# Patient Record
Sex: Female | Born: 1950 | Race: White | Hispanic: No | Smoking: Never smoker
Health system: Southern US, Community
[De-identification: ages and names within clinical notes are randomized; demographics above are authoritative.]

## PROBLEM LIST (undated history)

## (undated) DIAGNOSIS — E538 Deficiency of other specified B group vitamins: Secondary | ICD-10-CM

## (undated) DIAGNOSIS — I10 Essential (primary) hypertension: Secondary | ICD-10-CM

## (undated) HISTORY — PX: FLEXIBLE SIGMOIDOSCOPY: SHX1649

---

## 1988-12-26 HISTORY — PX: ABDOMINAL HYSTERECTOMY: SHX81

## 2014-08-08 LAB — LIPID PANEL
Cholesterol: 219 mg/dL — AB (ref 0–200)
HDL: 67 mg/dL (ref 35–70)
LDL Cholesterol: 129 mg/dL
Triglycerides: 115 mg/dL (ref 40–160)

## 2014-08-08 LAB — BASIC METABOLIC PANEL
BUN: 15 mg/dL (ref 4–21)
Creatinine: 0.7 mg/dL (ref 0.5–1.1)

## 2014-08-08 LAB — CBC AND DIFFERENTIAL: Hemoglobin: 13.1 g/dL (ref 12.0–16.0)

## 2014-08-08 LAB — TSH: TSH: 3.7 u[IU]/mL (ref 0.41–5.90)

## 2015-08-19 ENCOUNTER — Other Ambulatory Visit: Payer: Self-pay | Admitting: Internal Medicine

## 2015-11-21 ENCOUNTER — Other Ambulatory Visit: Payer: Self-pay | Admitting: Internal Medicine

## 2015-11-21 ENCOUNTER — Encounter: Payer: Self-pay | Admitting: Internal Medicine

## 2015-11-21 DIAGNOSIS — E538 Deficiency of other specified B group vitamins: Secondary | ICD-10-CM | POA: Insufficient documentation

## 2015-11-21 DIAGNOSIS — I10 Essential (primary) hypertension: Secondary | ICD-10-CM | POA: Insufficient documentation

## 2015-11-21 DIAGNOSIS — J302 Other seasonal allergic rhinitis: Secondary | ICD-10-CM | POA: Insufficient documentation

## 2015-11-21 DIAGNOSIS — N951 Menopausal and female climacteric states: Secondary | ICD-10-CM | POA: Insufficient documentation

## 2015-11-21 DIAGNOSIS — E785 Hyperlipidemia, unspecified: Secondary | ICD-10-CM | POA: Insufficient documentation

## 2015-11-21 DIAGNOSIS — D510 Vitamin B12 deficiency anemia due to intrinsic factor deficiency: Secondary | ICD-10-CM | POA: Insufficient documentation

## 2015-11-21 DIAGNOSIS — G5600 Carpal tunnel syndrome, unspecified upper limb: Secondary | ICD-10-CM | POA: Insufficient documentation

## 2015-12-30 ENCOUNTER — Other Ambulatory Visit: Payer: Self-pay | Admitting: Internal Medicine

## 2015-12-30 ENCOUNTER — Encounter: Payer: Self-pay | Admitting: Internal Medicine

## 2015-12-30 ENCOUNTER — Telehealth: Payer: Self-pay

## 2015-12-30 ENCOUNTER — Other Ambulatory Visit: Payer: Self-pay

## 2015-12-30 MED ORDER — KETOCONAZOLE 2 % EX SHAM: 1.0000 "application " | MEDICATED_SHAMPOO | CUTANEOUS | Status: DC

## 2015-12-30 NOTE — Telephone Encounter (Signed)
Patient was scheduled for appointment with you today and she states that she is okay with not having the appointment but, states that she will need a refill of her Nizoral and would like this sent to [facility] in [place].

## 2016-03-18 ENCOUNTER — Ambulatory Visit
Admission: RE | Admit: 2016-03-18 | Discharge: 2016-03-18 | Disposition: A | Discharge status: Home / Self Care | Payer: Medicare Other | Source: Ambulatory Visit | Attending: Internal Medicine | Admitting: Internal Medicine

## 2016-03-18 ENCOUNTER — Ambulatory Visit (INDEPENDENT_AMBULATORY_CARE_PROVIDER_SITE_OTHER): Payer: Self-pay | Admitting: Internal Medicine

## 2016-03-18 ENCOUNTER — Encounter: Payer: Self-pay | Admitting: Internal Medicine

## 2016-03-18 VITALS — BP 128/64 | HR 76 | Ht 63.0 in | Wt 152.4 lb

## 2016-03-18 DIAGNOSIS — M25511 Pain in right shoulder: Secondary | ICD-10-CM | POA: Insufficient documentation

## 2016-03-18 DIAGNOSIS — Z1211 Encounter for screening for malignant neoplasm of colon: Secondary | ICD-10-CM

## 2016-03-18 DIAGNOSIS — J302 Other seasonal allergic rhinitis: Secondary | ICD-10-CM

## 2016-03-18 DIAGNOSIS — Z0000 Encounter for general adult medical examination without abnormal findings: Secondary | ICD-10-CM

## 2016-03-18 DIAGNOSIS — E785 Hyperlipidemia, unspecified: Secondary | ICD-10-CM

## 2016-03-18 DIAGNOSIS — Z23 Encounter for immunization: Secondary | ICD-10-CM

## 2016-03-18 DIAGNOSIS — Z1239 Encounter for other screening for malignant neoplasm of breast: Secondary | ICD-10-CM

## 2016-03-18 DIAGNOSIS — I10 Essential (primary) hypertension: Secondary | ICD-10-CM

## 2016-03-18 DIAGNOSIS — D510 Vitamin B12 deficiency anemia due to intrinsic factor deficiency: Secondary | ICD-10-CM

## 2016-03-18 DIAGNOSIS — S42309A Unspecified fracture of shaft of humerus, unspecified arm, initial encounter for closed fracture: Secondary | ICD-10-CM | POA: Insufficient documentation

## 2016-03-18 HISTORY — DX: Unspecified fracture of shaft of humerus, unspecified arm, initial encounter for closed fracture: S42.309A

## 2016-03-18 LAB — POCT URINALYSIS DIPSTICK
BILIRUBIN UA: NEGATIVE
GLUCOSE UA: NEGATIVE
Ketones, UA: NEGATIVE
LEUKOCYTES UA: NEGATIVE
NITRITE UA: NEGATIVE
PH UA: 7
Protein, UA: NEGATIVE
RBC UA: NEGATIVE
Spec Grav, UA: 1.015
UROBILINOGEN UA: 0.2

## 2016-03-18 MED ORDER — CYANOCOBALAMIN 1000 MCG/ML IJ SOLN: 1000.0000 ug | INTRAMUSCULAR | Status: DC

## 2016-03-18 NOTE — Patient Instructions (Addendum)
Health Maintenance  Topic Date Due  . MAMMOGRAM  02/21/1999  . ZOSTAVAX  02/21/2009  . COLONOSCOPY  12/27/2010  . PNA vac Low Risk Adult (1 of 2 - PCV13) 02/21/2014  . INFLUENZA VACCINE  03/25/2016 (Originally 07/27/2015)  . Hepatitis C Screening  03/18/2021 (Originally [birth year])  . TETANUS/TDAP  06/27/2023  . DEXA SCAN  Addressed   Pneumococcal Conjugate Vaccine (PCV13)  1. Why get vaccinated? Vaccination can protect both children and adults from pneumococcal disease. Pneumococcal disease is caused by bacteria that can spread from person to person through close contact. It can cause ear infections, and it can also lead to more serious infections of the: 1. Lungs (pneumonia), 2. Blood (bacteremia), and 3. Covering of the brain and spinal cord (meningitis). Pneumococcal pneumonia is most common among adults. Pneumococcal meningitis can cause deafness and brain damage, and it kills about 1 child in 10 who get it. Anyone can get pneumococcal disease, but children under 38 years of age and adults 47 years and older, people with certain medical conditions, and cigarette smokers are at the highest risk. Before there was a vaccine, the [place] saw:  more than 700 cases of meningitis,  about 13,000 blood infections,  about 5 million ear infections, and  about 200 deaths in children under 5 each year from pneumococcal disease. Since vaccine became available, severe pneumococcal disease in these children has fallen by 88%. About 18,000 older adults die of pneumococcal disease each year in the [place]. Treatment of pneumococcal infections with penicillin and other drugs is not as effective as it used to be, because some strains of the disease have become resistant to these drugs. This makes prevention of the disease, through vaccination, even more important. 2. PCV13 vaccine Pneumococcal conjugate vaccine (called PCV13) protects against 13 types of pneumococcal bacteria. PCV13 is  routinely given to children at 2, 4, 6, and 40-58 months of age. It is also recommended for children and adults 1 to 56 years of age with certain health conditions, and for all adults 5 years of age and older. Your doctor can give you details. 3. Some people should not get this vaccine Anyone who has ever had a life-threatening allergic reaction to a dose of this vaccine, to an earlier pneumococcal vaccine called PCV7, or to any vaccine containing diphtheria toxoid (for example, DTaP), should not get PCV13. Anyone with a severe allergy to any component of PCV13 should not get the vaccine. Tell your doctor if the person being vaccinated has any severe allergies. If the person scheduled for vaccination is not feeling well, your healthcare provider might decide to reschedule the shot on another day. 4. Risks of a vaccine reaction With any medicine, including vaccines, there is a chance of reactions. These are usually mild and go away on their own, but serious reactions are also possible. Problems reported following PCV13 varied by age and dose in the series. The most common problems reported among children were:  About half became drowsy after the shot, had a temporary loss of appetite, or had redness or tenderness where the shot was given.  About 1 out of 3 had swelling where the shot was given.  About 1 out of 3 had a mild fever, and about 1 in 20 had a fever over 102.26F.  Up to about 8 out of 10 became fussy or irritable. Adults have reported pain, redness, and swelling where the shot was given; also mild fever, fatigue, headache, chills, or muscle pain. Young  children who get PCV13 along with inactivated flu vaccine at the same time may be at increased risk for seizures caused by fever. Ask your doctor for more information. Problems that could happen after any vaccine:  People sometimes faint after a medical procedure, including vaccination. Sitting or lying down for about 15 minutes can help  prevent fainting, and injuries caused by a fall. Tell your doctor if you feel dizzy, or have vision changes or ringing in the ears.  Some older children and adults get severe pain in the shoulder and have difficulty moving the arm where a shot was given. This happens very rarely.  Any medication can cause a severe allergic reaction. Such reactions from a vaccine are very rare, estimated at about 1 in a million doses, and would happen within a few minutes to a few hours after the vaccination. As with any medicine, there is a very small chance of a vaccine causing a serious injury or death. The safety of vaccines is always being monitored. For more information, visit: http://www.aguilar.org/ 5. What if there is a serious reaction? What should I look for?  Look for anything that concerns you, such as signs of a severe allergic reaction, very high fever, or unusual behavior. Signs of a severe allergic reaction can include hives, swelling of the face and throat, difficulty breathing, a fast heartbeat, dizziness, and weakness-usually within a few minutes to a few hours after the vaccination. What should I do?  If you think it is a severe allergic reaction or other emergency that can't wait, call 9-1-1 or get the person to the nearest hospital. Otherwise, call your doctor. Reactions should be reported to the Vaccine Adverse Event Reporting System (VAERS). Your doctor should file this report, or you can do it yourself through the VAERS web site at www.vaers.SamedayNews.es, or by calling 204-190-3588. VAERS does not give medical advice. 6. The National Vaccine Injury Compensation Program The [employer] Vaccine Injury Compensation Program (VICP) is a federal program that was created to compensate people who may have been injured by certain vaccines. Persons who believe they may have been injured by a vaccine can learn about the program and about filing a claim by calling 845-533-4303 or visiting the [place]  website at GoldCloset.com.ee. There is a time limit to file a claim for compensation. 7. How can I learn more?  Ask your healthcare provider. He or she can give you the vaccine package insert or suggest other sources of information.  Call your local or state health department.  Contact the Centers for Disease Control and Prevention (CDC):  Call 801 423 8272 (1-800-CDC-INFO) or  Visit CDC's website at http://hunter.com/ Vaccine Information Statement PCV13 Vaccine (10/30/2014)   This information is not intended to replace advice given to you by your health care provider. Make sure you discuss any questions you have with your health care provider.   Document Released: 10/09/2006 Document Revised: 01/02/2015 Document Reviewed: 11/06/2014 Elsevier Interactive Patient Education 2016 [employer] Practicing breast self-awareness may pick up problems early, prevent significant medical complications, and possibly save your life. By practicing breast self-awareness, you can become familiar with how your breasts look and feel and if your breasts are changing. This allows you to notice changes early. It can also offer you some reassurance that your breast health is good. One way to learn what is normal for your breasts and whether your breasts are changing is to do a breast self-exam. If you find a lump or something that was not  present in the past, it is best to contact your caregiver right away. Other findings that should be evaluated by your caregiver include nipple discharge, especially if it is bloody; skin changes or reddening; areas where the skin seems to be pulled in (retracted); or new lumps and bumps. Breast pain is seldom associated with cancer (malignancy), but should also be evaluated by a caregiver. HOW TO PERFORM A BREAST SELF-EXAM The best time to examine your breasts is 5-7 days after your menstrual period is over. During menstruation, the  breasts are lumpier, and it may be more difficult to pick up changes. If you do not menstruate, have reached menopause, or had your uterus removed (hysterectomy), you should examine your breasts at regular intervals, such as monthly. If you are breastfeeding, examine your breasts after a feeding or after using a breast pump. Breast implants do not decrease the risk for lumps or tumors, so continue to perform breast self-exams as recommended. Talk to your caregiver about how to determine the difference between the implant and breast tissue. Also, talk about the amount of pressure you should use during the exam. Over time, you will become more familiar with the variations of your breasts and more comfortable with the exam. A breast self-exam requires you to remove all your clothes above the waist. 4. Look at your breasts and nipples. Stand in front of a mirror in a room with good lighting. With your hands on your hips, push your hands firmly downward. Look for a difference in shape, contour, and size from one breast to the other (asymmetry). Asymmetry includes puckers, dips, or bumps. Also, look for skin changes, such as reddened or scaly areas on the breasts. Look for nipple changes, such as discharge, dimpling, repositioning, or redness. 5. Carefully feel your breasts. This is best done either in the shower or tub while using soapy water or when flat on your back. Place the arm (on the side of the breast you are examining) above your head. Use the pads (not the fingertips) of your three middle fingers on your opposite hand to feel your breasts. Start in the underarm area and use  inch (2 cm) overlapping circles to feel your breast. Use 3 different levels of pressure (light, medium, and firm pressure) at each circle before moving to the next circle. The light pressure is needed to feel the tissue closest to the skin. The medium pressure will help to feel breast tissue a little deeper, while the firm pressure is  needed to feel the tissue close to the ribs. Continue the overlapping circles, moving downward over the breast until you feel your ribs below your breast. Then, move one finger-width towards the center of the body. Continue to use the  inch (2 cm) overlapping circles to feel your breast as you move slowly up toward the collar bone (clavicle) near the base of the neck. Continue the up and down exam using all 3 pressures until you reach the middle of the chest. Do this with each breast, carefully feeling for lumps or changes. 6.  Keep a written record with breast changes or normal findings for each breast. By writing this information down, you do not need to depend only on memory for size, tenderness, or location. Write down where you are in your menstrual cycle, if you are still menstruating. Breast tissue can have some lumps or thick tissue. However, see your caregiver if you find anything that concerns you.  SEEK MEDICAL CARE IF:  You see  a change in shape, contour, or size of your breasts or nipples.   You see skin changes, such as reddened or scaly areas on the breasts or nipples.   You have an unusual discharge from your nipples.   You feel a new lump or unusually thick areas.    This information is not intended to replace advice given to you by your health care provider. Make sure you discuss any questions you have with your health care provider.   Document Released: 12/12/2005 Document Revised: 11/28/2012 Document Reviewed: 03/28/2012 Elsevier Interactive Patient Education [employer].

## 2016-03-18 NOTE — Progress Notes (Signed)
Patient: Shelly Fischer, Female    DOB: 02/18/1951, 65 y.o.   MRN: [id number] Visit Date: 03/18/2016  Today's Provider: Halina Maidens, MD   Chief Complaint  Patient presents with  . Medicare Wellness   Subjective:    Annual wellness visit Shelly Fischer is a 65 y.o. female who presents today for her Subsequent Annual Wellness Visit. She feels fairly well. She reports exercising intermittently. She reports she is sleeping well. She has not been having mammograms -but will have one this year. She is also due for colonoscopy - very hesitant due to discomfort from last one. ----------------------------------------------------------- Hypertension This is a chronic problem. The current episode started more than 1 year ago. The problem is unchanged. The problem is controlled. Pertinent negatives include no chest pain, headaches, palpitations or shortness of breath. Past treatments include ACE inhibitors and diuretics.  Shoulder pain - no distinct injury.  It has improved with otc pain patches and reducing repetitive use.  She also using Advil 2 tabs per day.  Review of Systems  Constitutional: Negative for fever, chills and fatigue.  HENT: Negative for congestion, hearing loss, tinnitus, trouble swallowing and voice change.   Eyes: Negative for visual disturbance.  Respiratory: Negative for cough, chest tightness, shortness of breath and wheezing.   Cardiovascular: Negative for chest pain, palpitations and leg swelling.  Gastrointestinal: Negative for vomiting, abdominal pain, diarrhea and constipation.  Endocrine: Negative for polydipsia and polyuria.  Genitourinary: Negative for dysuria, frequency, vaginal bleeding, vaginal discharge and genital sores.  Musculoskeletal: Positive for arthralgias (right shoulder). Negative for joint swelling and gait problem.  Skin: Negative for color change and rash.  Neurological: Negative for dizziness, tremors, light-headedness and headaches.    Hematological: Negative for adenopathy. Does not bruise/bleed easily.  Psychiatric/Behavioral: Negative for sleep disturbance and dysphoric mood. The patient is not nervous/anxious.     Social History   Social History  . Marital Status: Unknown    Spouse Name: N/A  . Number of Children: N/A  . Years of Education: N/A   Occupational History  . Not on file.   Social History Main Topics  . Smoking status: Never Smoker   . Smokeless tobacco: Not on file  . Alcohol Use: No  . Drug Use: No  . Sexual Activity: Not on file   Other Topics Concern  . Not on file   Social History Narrative    Patient Active Problem List   Diagnosis Date Noted  . Carpal tunnel syndrome 11/21/2015  . Dyslipidemia 11/21/2015  . Essential (primary) hypertension 11/21/2015  . Hot flash, menopausal 11/21/2015  . Addison anemia 11/21/2015  . Allergic rhinitis, seasonal 11/21/2015    Past Surgical History  Procedure Laterality Date  . Abdominal hysterectomy      Her family history includes CAD (age of onset: 37) in her brother; CAD (age of onset: 77) in her mother.    Previous Medications   ASPIRIN 81 MG TABLET    Take 81 mg by mouth 2 (two) times a week.   CETIRIZINE (ZYRTEC) 10 MG TABLET    Take 1 tablet by mouth daily.   CYANOCOBALAMIN (,VITAMIN B-12,) 1000 MCG/ML INJECTION    Inject 1 mL into the muscle every 30 (thirty) days.   FLAXSEED, LINSEED, (FLAX SEED OIL) 1000 MG CAPS    Take by mouth.   IBUPROFEN (ADVIL,MOTRIN) 200 MG TABLET    Take 200 mg by mouth 2 (two) times daily.   KETOCONAZOLE (NIZORAL) 2 % SHAMPOO  Apply 1 application topically 2 (two) times a week.   LOSARTAN-HYDROCHLOROTHIAZIDE (HYZAAR) 100-25 MG PER TABLET    TAKE 1 TABLET BY MOUTH DAILY    No care team member to display     Objective:   Vitals: BP 128/64 mmHg  Pulse 76  Ht 5\' 3"  (1.6 m)  Wt 152 lb 6.4 oz (69.128 kg)  BMI 27.00 kg/m2  Physical Exam  Constitutional: She is oriented to person, place, and time.  She appears well-developed and well-nourished. No distress.  HENT:  Head: Normocephalic and atraumatic.  Right Ear: Tympanic membrane and ear canal normal.  Left Ear: Tympanic membrane and ear canal normal.  Nose: Right sinus exhibits no maxillary sinus tenderness. Left sinus exhibits no maxillary sinus tenderness.  Mouth/Throat: Uvula is midline and oropharynx is clear and moist.  Eyes: Conjunctivae and EOM are normal. Right eye exhibits no discharge. Left eye exhibits no discharge. No scleral icterus.  Neck: Normal range of motion. Carotid bruit is not present. No erythema present. No thyromegaly present.  Cardiovascular: Normal rate, regular rhythm, normal heart sounds and normal pulses.   Pulmonary/Chest: Effort normal. No respiratory distress. She has no wheezes. Right breast exhibits no mass, no nipple discharge, no skin change and no tenderness. Left breast exhibits no mass, no nipple discharge, no skin change and no tenderness.  Abdominal: Soft. Bowel sounds are normal. There is no hepatosplenomegaly. There is no tenderness. There is no CVA tenderness.  Musculoskeletal:       Right shoulder: She exhibits decreased range of motion and tenderness (over AC joint).  Lymphadenopathy:    She has no cervical adenopathy.    She has no axillary adenopathy.  Neurological: She is alert and oriented to person, place, and time. She has normal strength and normal reflexes. No cranial nerve deficit or sensory deficit.  Skin: Skin is warm, dry and intact. No rash noted.  Psychiatric: She has a normal mood and affect. Her speech is normal and behavior is normal. Thought content normal.  Nursing note and vitals reviewed.   Activities of Daily Living In your present state of health, do you have any difficulty performing the following activities: 03/18/2016  Hearing? N  Vision? N  Difficulty concentrating or making decisions? N  Walking or climbing stairs? N  Dressing or bathing? N  Doing errands,  shopping? N    Fall Risk Assessment Fall Risk  03/18/2016  Falls in the past year? No      Depression Screen PHQ 2/9 Scores 03/18/2016  PHQ - 2 Score 0    Cognitive Testing - 6-CIT   Correct? Score   What year is it? yes 0 Yes = 0    No = 4  What month is it? yes 0 Yes = 0    No = 3  Remember:     [address]ia [address], [place], [place]     What time is it? yes 0 Yes = 0    No = 3  Count backwards from 20 to 1 yes 0 Correct = 0    1 error = 2   More than 1 error = 4  Say the months of the year in reverse. yes 0 Correct = 0    1 error = 2   More than 1 error = 4  What address did I ask you to remember? yes 0 Correct = 0  1 error = 2    2 error = 4    3 error =  6    4 error = 8    All wrong = 10       TOTAL SCORE  0/28   Interpretation:  Normal  Normal (0-7) Abnormal (8-28)      Medicare Annual Wellness Visit Summary:  Reviewed patient's Family Medical History Reviewed and updated list of patient's medical providers Assessment of cognitive impairment was done Assessed patient's functional ability Established a written schedule for health screening [place] Completed and Reviewed  Exercise Activities and Dietary recommendations Goals    None      Immunization History  Administered Date(s) Administered  . Tdap 06/26/2013    Health Maintenance  Topic Date Due  . Hepatitis C Screening  July 24, 1951  . MAMMOGRAM  02/21/1999  . ZOSTAVAX  02/21/2009  . COLONOSCOPY  12/27/2010  . PNA vac Low Risk Adult (1 of 2 - PCV13) 02/21/2014  . INFLUENZA VACCINE  03/25/2016 (Originally 07/27/2015)  . TETANUS/TDAP  06/27/2023  . DEXA SCAN  Addressed     Discussed health benefits of physical activity, and encouraged her to engage in regular exercise appropriate for her age and condition.    ------------------------------------------------------------------------------------------------------------   Assessment & Plan:  1. Medicare annual wellness visit,  subsequent Measures satisfied  2. Essential (primary) hypertension controlled - CBC with Differential/Platelet - Comprehensive metabolic panel - TSH - POCT urinalysis dipstick  3. Allergic rhinitis, seasonal  4. Dyslipidemia Will advise if medication is needed - Lipid panel  5. Addison anemia Continue monthly B12 injection - cyanocobalamin (,VITAMIN B-12,) 1000 MCG/ML injection; Inject 1 mL (1,000 mcg total) into the muscle every 30 (thirty) days.  Dispense: 1 mL; Refill: 12  6. Colon cancer screening - Ambulatory referral to Gastroenterology  7. Breast cancer screening - MM DIGITAL SCREENING BILATERAL; Future  8. Need for pneumococcal vaccination - Pneumococcal conjugate vaccine 13-valent IM  9. Shoulder pain, right Seems like AC joint arthritis; continue Advil as needed and ice or heat; modified activities - DG Shoulder Right; Future  Rx given to get Zostavax after 04/29/16  Halina Maidens, MD [facility]  03/18/2016

## 2016-03-19 LAB — LIPID PANEL
CHOLESTEROL TOTAL: 210 mg/dL — AB (ref 100–199)
Chol/HDL Ratio: 3.1 ratio units (ref 0.0–4.4)
HDL: 68 mg/dL (ref >39)
LDL CALC: 117 mg/dL — AB (ref 0–99)
TRIGLYCERIDES: 124 mg/dL (ref 0–149)
VLDL CHOLESTEROL CAL: 25 mg/dL (ref 5–40)

## 2016-03-19 LAB — COMPREHENSIVE METABOLIC PANEL
ALK PHOS: 112 IU/L (ref 39–117)
ALT: 31 IU/L (ref 0–32)
AST: 24 IU/L (ref 0–40)
Albumin/Globulin Ratio: 2 (ref 1.2–2.2)
Albumin: 4.5 g/dL (ref 3.6–4.8)
BILIRUBIN TOTAL: 0.4 mg/dL (ref 0.0–1.2)
BUN/Creatinine Ratio: 21 (ref 11–26)
BUN: 13 mg/dL (ref 8–27)
CHLORIDE: 97 mmol/L (ref 96–106)
CO2: 27 mmol/L (ref 18–29)
CREATININE: 0.62 mg/dL (ref 0.57–1.00)
Calcium: 9.4 mg/dL (ref 8.7–10.3)
GFR calc Af Amer: 108 mL/min/{1.73_m2} (ref >59)
GFR calc non Af Amer: 94 mL/min/{1.73_m2} (ref >59)
GLUCOSE: 93 mg/dL (ref 65–99)
Globulin, Total: 2.3 g/dL (ref 1.5–4.5)
Potassium: 3.9 mmol/L (ref 3.5–5.2)
SODIUM: 142 mmol/L (ref 134–144)
Total Protein: 6.8 g/dL (ref 6.0–8.5)

## 2016-03-19 LAB — CBC WITH DIFFERENTIAL/PLATELET
BASOS ABS: 0.1 10*3/uL (ref 0.0–0.2)
Basos: 1 %
EOS (ABSOLUTE): 0.3 10*3/uL (ref 0.0–0.4)
Eos: 6 %
Hematocrit: 39.1 % (ref 34.0–46.6)
Hemoglobin: 13.1 g/dL (ref 11.1–15.9)
Immature Grans (Abs): 0 10*3/uL (ref 0.0–0.1)
Immature Granulocytes: 0 %
LYMPHS ABS: 1.2 10*3/uL (ref 0.7–3.1)
Lymphs: 25 %
MCH: 28.9 pg (ref 26.6–33.0)
MCHC: 33.5 g/dL (ref 31.5–35.7)
MCV: 86 fL (ref 79–97)
MONOCYTES: 9 %
MONOS ABS: 0.4 10*3/uL (ref 0.1–0.9)
Neutrophils Absolute: 2.7 10*3/uL (ref 1.4–7.0)
Neutrophils: 59 %
Platelets: 251 10*3/uL (ref 150–379)
RBC: 4.54 x10E6/uL (ref 3.77–5.28)
RDW: 14 % (ref 12.3–15.4)
WBC: 4.7 10*3/uL (ref 3.4–10.8)

## 2016-03-19 LAB — TSH: TSH: 4.75 u[IU]/mL — ABNORMAL HIGH (ref 0.450–4.500)

## 2016-03-21 ENCOUNTER — Telehealth: Payer: Self-pay

## 2016-03-21 NOTE — Telephone Encounter (Signed)
Spoke with patient. Patient advised of all results and verbalized understanding. Will call back with any future questions or concerns. MAH  

## 2016-03-21 NOTE — Telephone Encounter (Signed)
-----   Message from Glean Hess, MD sent at 03/19/2016 12:57 PM EDT ----- Labs are normal.  Cholesterol is great.  Thyroid may be borderline low but no medication is needed at this time.  Recommend rechecking in 6 months.

## 2016-03-21 NOTE — Telephone Encounter (Signed)
-----   Message from Glean Hess, MD sent at 03/18/2016  1:12 PM EDT ----- Shoulder xray shows a small bone spur which is likely the cause of symptoms.  If worsening, would consult Orthopedics.

## 2016-03-21 NOTE — Telephone Encounter (Signed)
Left message for patient to call back  

## 2016-06-07 ENCOUNTER — Other Ambulatory Visit: Payer: Self-pay | Admitting: Internal Medicine

## 2016-08-10 ENCOUNTER — Other Ambulatory Visit: Payer: Self-pay | Admitting: Internal Medicine

## 2017-02-27 IMAGING — CR DG SHOULDER 2+V*R*
3 series · 3 of 3 positions shown · non-contrast
Comparison: None impacts

CLINICAL DATA: Six-month history of right shoulder pain centered
over the proximal humerus and humeral head; painful range of motion,
no known injury.

EXAM:
RIGHT SHOULDER - 2+ VIEW

[shoulder grashey]
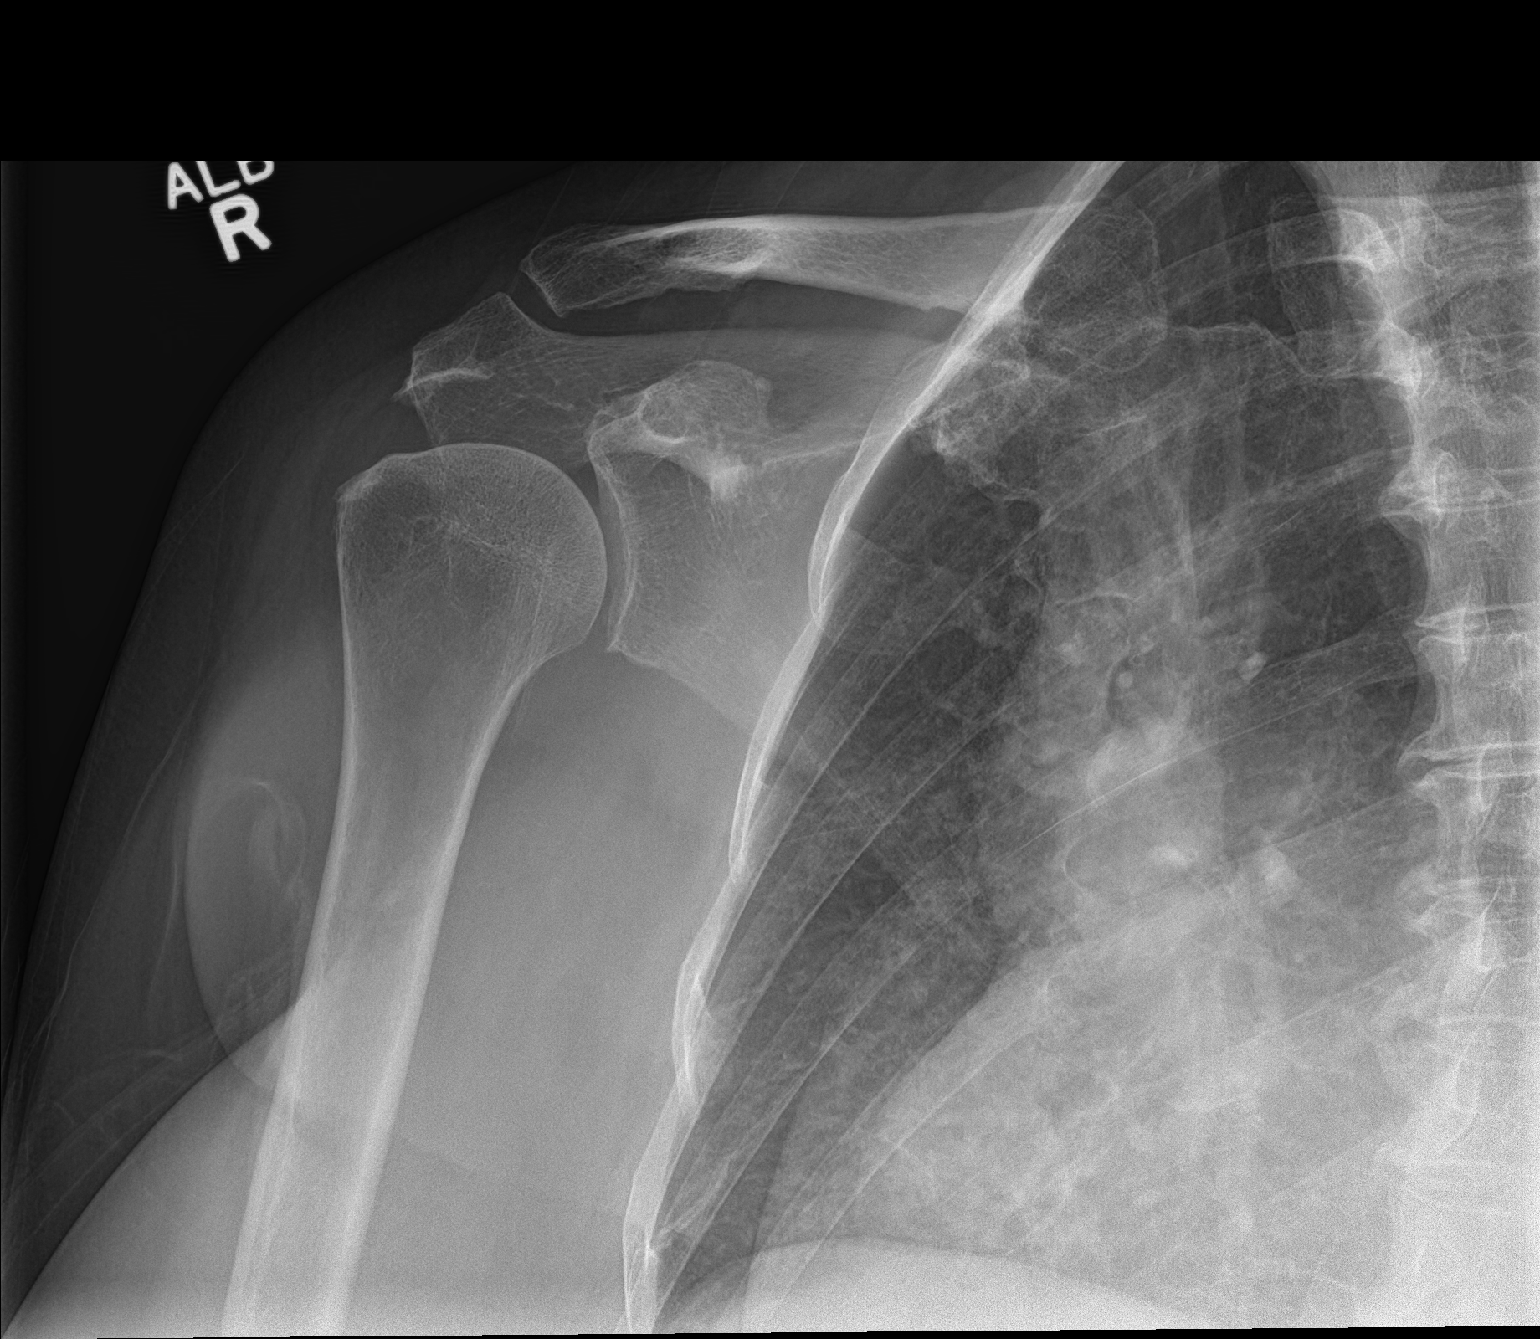

[shoulder y view]
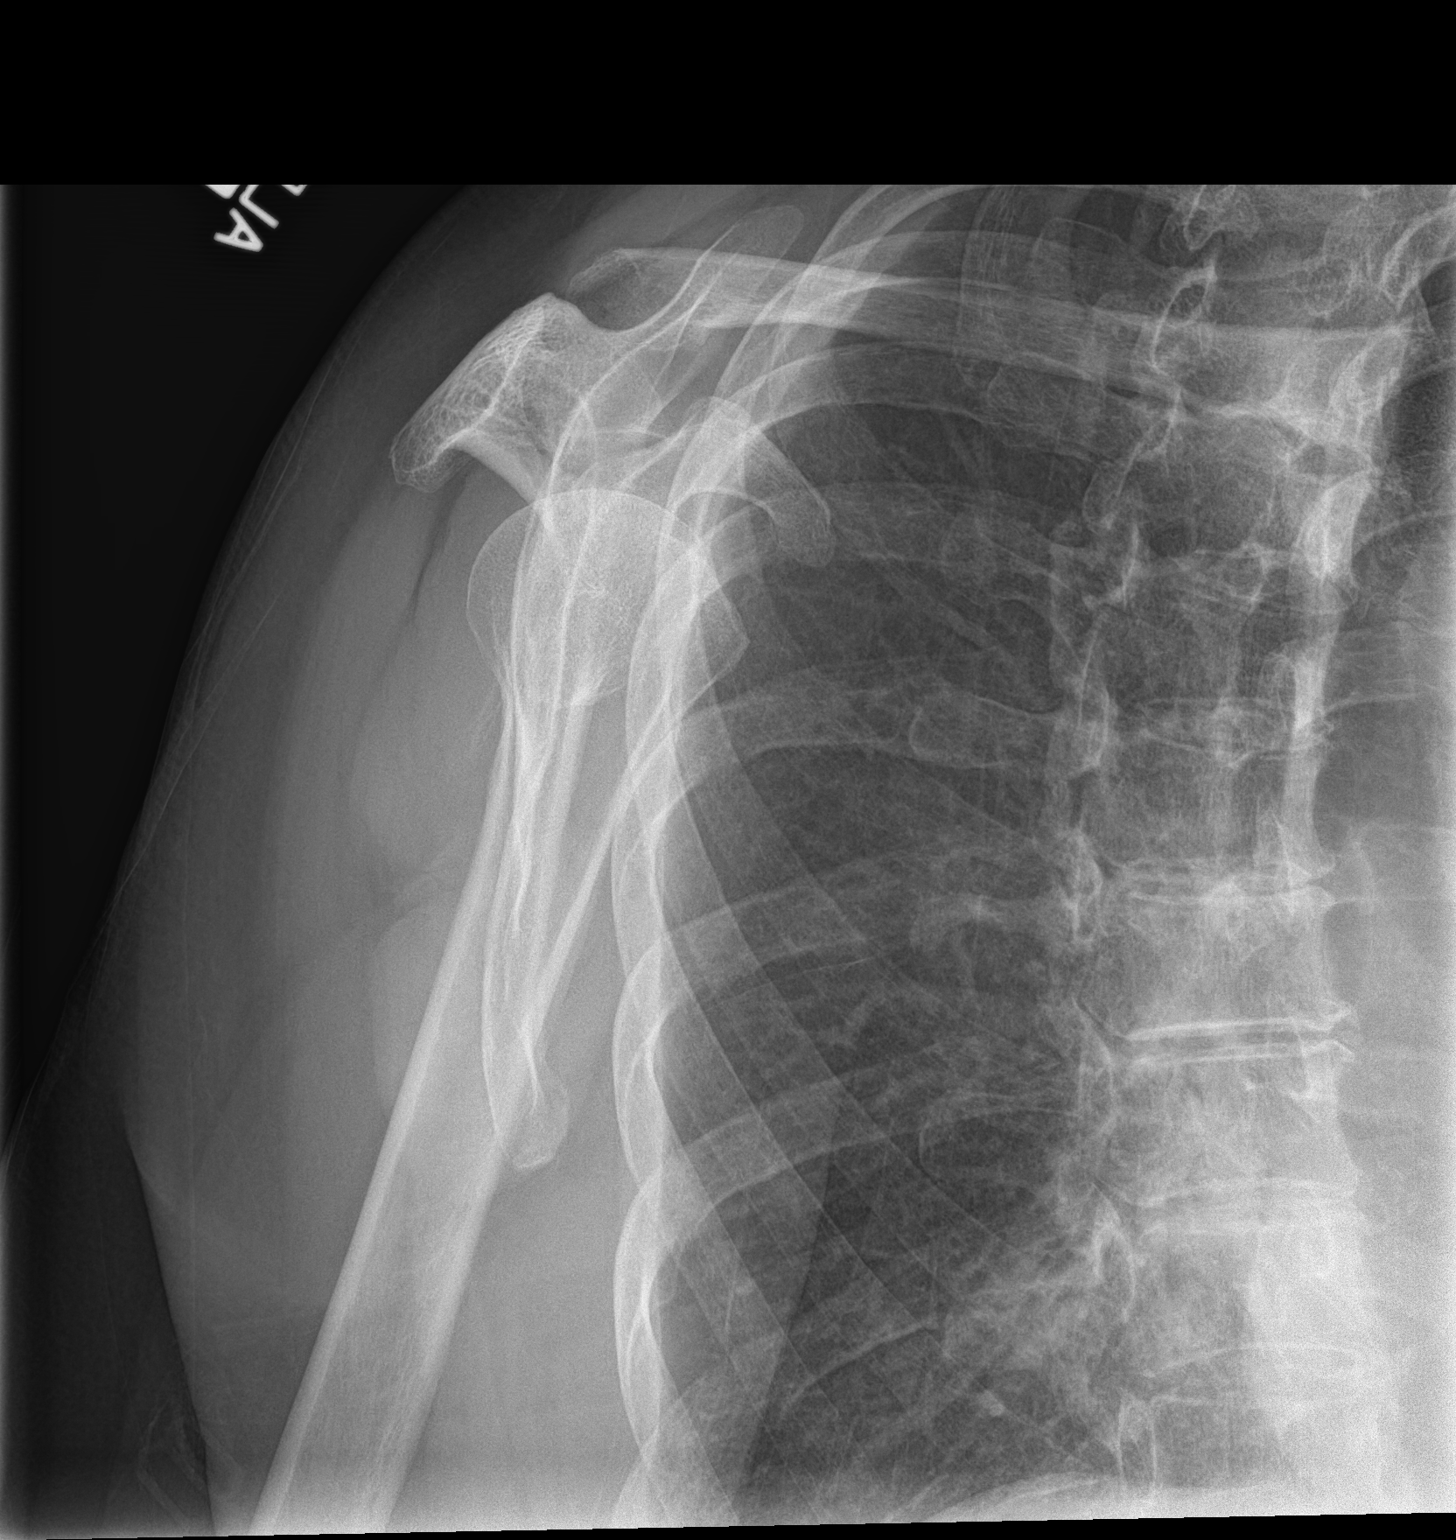

[shoulder axial]
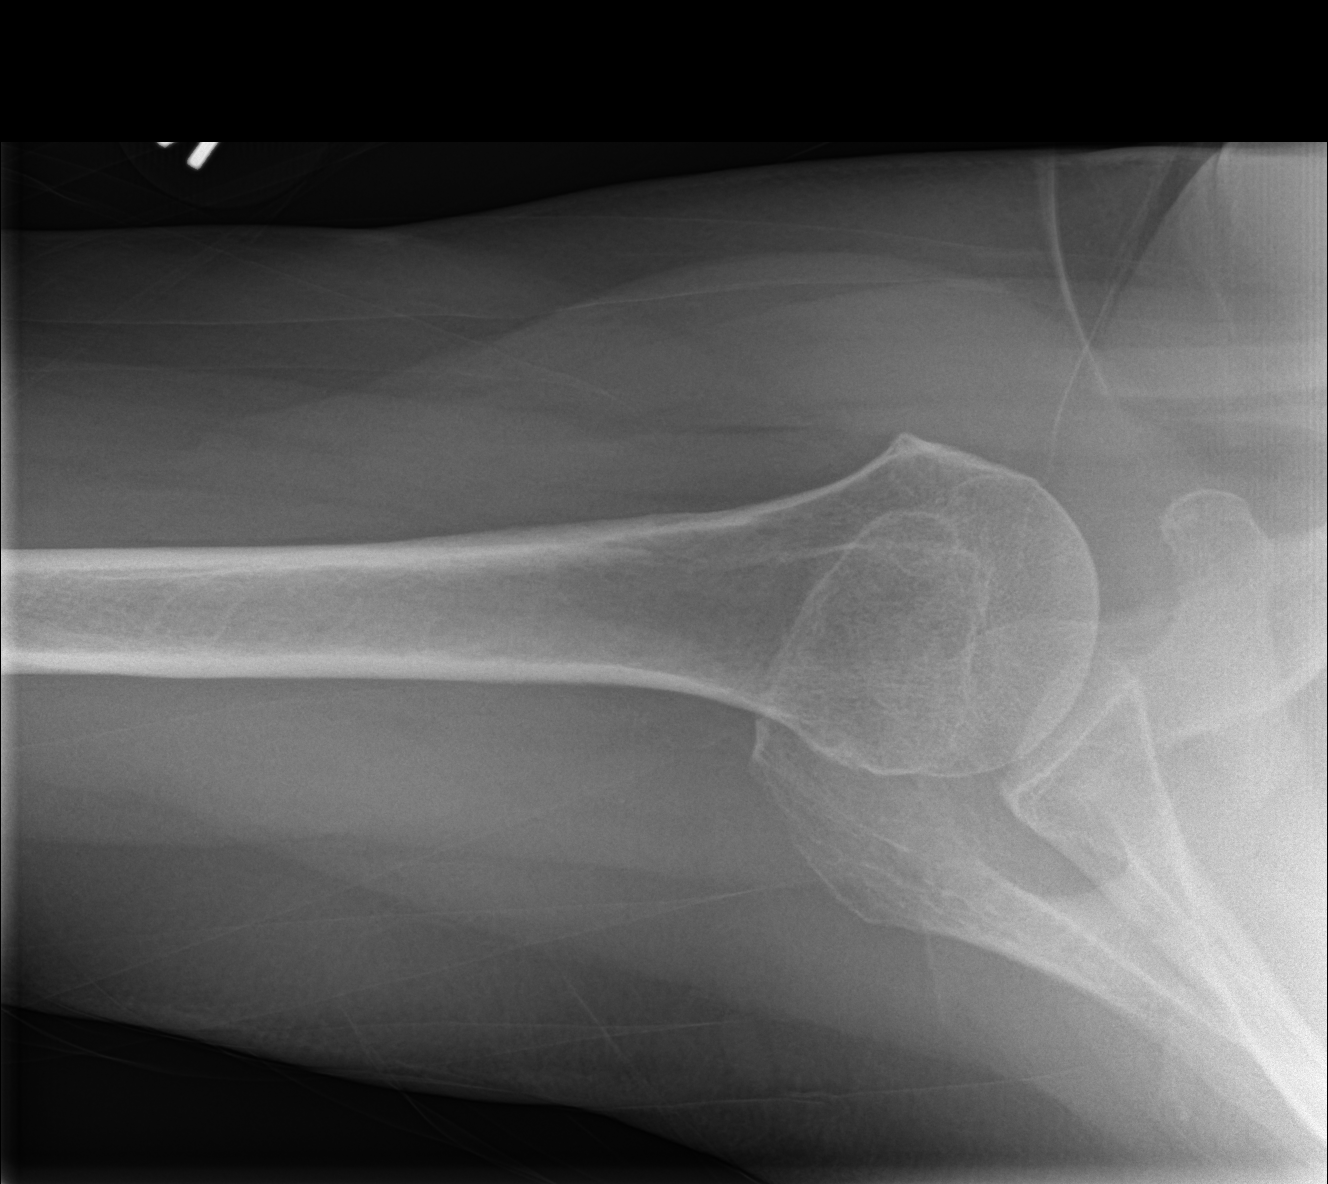

[3 of 3 positions shown; findings below may reference images not displayed]

FINDINGS: The bones appear adequately mineralized. The glenohumeral joint
space is preserved. There is small subacromial spur. The AC joint is
unremarkable. The observed portions of the right clavicle and upper
right ribs are normal.
IMPRESSION: There is mild degenerative subacromial spurring. There is no
significant bony abnormality otherwise.

## 2017-04-06 ENCOUNTER — Ambulatory Visit (INDEPENDENT_AMBULATORY_CARE_PROVIDER_SITE_OTHER): Payer: Medicare Other | Admitting: Internal Medicine

## 2017-04-06 ENCOUNTER — Encounter: Payer: Self-pay | Admitting: Internal Medicine

## 2017-04-06 VITALS — BP 122/82 | HR 74 | Ht 63.0 in | Wt 154.2 lb

## 2017-04-06 DIAGNOSIS — Z23 Encounter for immunization: Secondary | ICD-10-CM

## 2017-04-06 DIAGNOSIS — E785 Hyperlipidemia, unspecified: Secondary | ICD-10-CM

## 2017-04-06 DIAGNOSIS — Z1211 Encounter for screening for malignant neoplasm of colon: Secondary | ICD-10-CM

## 2017-04-06 DIAGNOSIS — D510 Vitamin B12 deficiency anemia due to intrinsic factor deficiency: Secondary | ICD-10-CM

## 2017-04-06 DIAGNOSIS — I10 Essential (primary) hypertension: Secondary | ICD-10-CM

## 2017-04-06 DIAGNOSIS — G4700 Insomnia, unspecified: Secondary | ICD-10-CM

## 2017-04-06 DIAGNOSIS — Z0000 Encounter for general adult medical examination without abnormal findings: Secondary | ICD-10-CM

## 2017-04-06 LAB — POCT URINALYSIS DIPSTICK
Bilirubin, UA: NEGATIVE
Glucose, UA: NEGATIVE
Ketones, UA: NEGATIVE
NITRITE UA: NEGATIVE
PROTEIN UA: NEGATIVE
RBC UA: NEGATIVE
Spec Grav, UA: 1.01 (ref 1.010–1.025)
UROBILINOGEN UA: 0.2 U/dL
pH, UA: 6 (ref 5.0–8.0)

## 2017-04-06 MED ORDER — TRAZODONE HCL 50 MG PO TABS: 25.0000 mg | ORAL_TABLET | Freq: Every day | ORAL | 2 refills | Status: DC

## 2017-04-06 MED ORDER — LOSARTAN POTASSIUM-HCTZ 100-25 MG PO TABS: 1.0000 | ORAL_TABLET | Freq: Every day | ORAL | 11 refills | Status: DC

## 2017-04-06 NOTE — Progress Notes (Signed)
Patient: Shelly Fischer, Female    DOB: Jan 13, 1951, 66 y.o.   MRN: [id number] Visit Date: 04/06/2017  Today's Provider: Halina Maidens, MD   Chief Complaint  Patient presents with  . Medicare Wellness   Subjective:    Annual wellness visit Shelly Fischer is a 66 y.o. female who presents today for her Subsequent Annual Wellness Visit. She feels well. She reports exercising none. She reports she is sleeping poorly - chronic.  She denies breast problems.   ----------------------------------------------------------- Hypertension  This is a chronic problem. The problem is controlled. Pertinent negatives include no chest pain, headaches, palpitations or shortness of breath.  Insomnia  Primary symptoms: sleep disturbance, frequent awakening.  The onset quality is undetermined. The problem occurs every several days. The symptoms are aggravated by work stress, family issues and anxiety. Treatments tried: melatonin.    Review of Systems  Constitutional: Negative for chills, fatigue and fever.  HENT: Negative for congestion, hearing loss, tinnitus, trouble swallowing and voice change.   Eyes: Negative for visual disturbance.  Respiratory: Negative for cough, chest tightness, shortness of breath and wheezing.   Cardiovascular: Negative for chest pain, palpitations and leg swelling.  Gastrointestinal: Negative for abdominal pain, constipation, diarrhea and vomiting.  Endocrine: Negative for polydipsia and polyuria.  Genitourinary: Negative for dysuria, frequency, genital sores, vaginal bleeding and vaginal discharge.  Musculoskeletal: Positive for arthralgias (intermittent knee and ankle). Negative for gait problem and joint swelling.  Skin: Negative for color change and rash.  Neurological: Negative for dizziness, tremors, light-headedness and headaches.  Hematological: Negative for adenopathy. Does not bruise/bleed easily.  Psychiatric/Behavioral: Positive for sleep disturbance. Negative  for dysphoric mood. The patient has insomnia. The patient is not nervous/anxious.     Social History   Social History  . Marital status: Unknown    Spouse name: N/A  . Number of children: N/A  . Years of education: N/A   Occupational History  . Not on file.   Social History Main Topics  . Smoking status: Never Smoker  . Smokeless tobacco: Never Used  . Alcohol use No  . Drug use: No  . Sexual activity: Not on file   Other Topics Concern  . Not on file   Social History Narrative  . No narrative on file    Patient Active Problem List   Diagnosis Date Noted  . Shoulder pain, right 03/18/2016  . Carpal tunnel syndrome 11/21/2015  . Dyslipidemia 11/21/2015  . Essential (primary) hypertension 11/21/2015  . Hot flash, menopausal 11/21/2015  . Addison anemia 11/21/2015  . Allergic rhinitis, seasonal 11/21/2015    Past Surgical History:  Procedure Laterality Date  . ABDOMINAL HYSTERECTOMY  1990   endometriosis    Her family history includes CAD (age of onset: 38) in her brother; CAD (age of onset: 23) in her mother.     Previous Medications   ASPIRIN 81 MG TABLET    Take 81 mg by mouth 2 (two) times a week.   CETIRIZINE (ZYRTEC) 10 MG TABLET    Take 1 tablet by mouth daily.   CYANOCOBALAMIN (,VITAMIN B-12,) 1000 MCG/ML INJECTION    INJECT 1ML AS DIRECTED EVERY MONTH   FLAXSEED, LINSEED, (FLAX SEED OIL) 1000 MG CAPS    Take by mouth.   IBUPROFEN (ADVIL,MOTRIN) 200 MG TABLET    Take 200 mg by mouth 2 (two) times daily.   KETOCONAZOLE (NIZORAL) 2 % SHAMPOO    Apply 1 application topically 2 (two) times a week.  Patient Care Team: Glean Hess, MD as PCP - General (Family Medicine)      Objective:   Vitals: BP 122/82   Pulse 74   Ht 5\' 3"  (1.6 m)   Wt 154 lb 3.2 oz (69.9 kg)   LMP  (Approximate) Comment: partial- still have ovaries  SpO2 100%   BMI 27.32 kg/m   Physical Exam  Constitutional: She is oriented to person, place, and time. She appears  well-developed and well-nourished. No distress.  HENT:  Head: Normocephalic and atraumatic.  Right Ear: Tympanic membrane and ear canal normal.  Left Ear: Tympanic membrane and ear canal normal.  Nose: Right sinus exhibits no maxillary sinus tenderness. Left sinus exhibits no maxillary sinus tenderness.  Mouth/Throat: Uvula is midline and oropharynx is clear and moist.  Eyes: Conjunctivae and EOM are normal. Right eye exhibits no discharge. Left eye exhibits no discharge. No scleral icterus.  Neck: Normal range of motion. Carotid bruit is not present. No erythema present. No thyromegaly present.  Cardiovascular: Normal rate, regular rhythm, normal heart sounds and normal pulses.   Pulmonary/Chest: Effort normal. No respiratory distress. She has no wheezes. Right breast exhibits no mass, no nipple discharge, no skin change and no tenderness. Left breast exhibits no mass, no nipple discharge, no skin change and no tenderness.  Abdominal: Soft. Bowel sounds are normal. There is no hepatosplenomegaly. There is no tenderness. There is no CVA tenderness.  Musculoskeletal: She exhibits no edema or tenderness.       Right knee: She exhibits normal range of motion, no swelling and no effusion.       Left knee: She exhibits normal range of motion, no swelling and no effusion.       Right ankle: Normal.       Left ankle: Normal.  Lymphadenopathy:    She has no cervical adenopathy.    She has no axillary adenopathy.  Neurological: She is alert and oriented to person, place, and time. She has normal strength and normal reflexes. No cranial nerve deficit or sensory deficit. Gait normal.  Skin: Skin is warm, dry and intact. No rash noted.  Psychiatric: She has a normal mood and affect. Her speech is normal and behavior is normal. Judgment and thought content normal. Cognition and memory are normal.  Nursing note and vitals reviewed.   Activities of Daily Living In your present state of health, do you  have any difficulty performing the following activities: 04/06/2017  Hearing? N  Vision? N  Difficulty concentrating or making decisions? N  Walking or climbing stairs? N  Dressing or bathing? N  Doing errands, shopping? N  Preparing Food and eating ? N  Using the Toilet? N  In the past six months, have you accidently leaked urine? N  Do you have problems with loss of bowel control? N  Managing your Medications? N  Managing your Finances? N  Housekeeping or managing your Housekeeping? N  Some recent data might be hidden    Fall Risk Assessment Fall Risk  04/06/2017 03/18/2016  Falls in the past year? No No      Depression Screen PHQ 2/9 Scores 04/06/2017 03/18/2016  PHQ - 2 Score 0 0   6CIT Screen 04/06/2017  What Year? 0 points  What month? 0 points  What time? 0 points  Count back from 20 2 points  Months in reverse 0 points  Repeat phrase 0 points  Total Score 2    Medicare Annual Wellness Visit Summary:  Reviewed  patient's Family Medical History Reviewed and updated list of patient's medical providers Assessment of cognitive impairment was done Assessed patient's functional ability Established a written schedule for health screening [place] Completed and Reviewed  Exercise Activities and Dietary recommendations Goals    None      Immunization History  Administered Date(s) Administered  . Pneumococcal Conjugate-13 03/18/2016  . Pneumococcal Polysaccharide-23 04/06/2017  . Tdap 06/26/2013    Health Maintenance  Topic Date Due  . MAMMOGRAM  02/21/1999  . COLONOSCOPY  12/27/2010  . Hepatitis C Screening  03/18/2021 (Originally 1951/05/10)  . INFLUENZA VACCINE  07/26/2017  . TETANUS/TDAP  06/27/2023  . DEXA SCAN  Addressed  . PNA vac Low Risk Adult  Completed    Discussed health benefits of physical activity, and encouraged her to engage in regular exercise appropriate for her age and condition.      ------------------------------------------------------------------------------------------------------------  Assessment & Plan:   1. Medicare annual wellness visit, subsequent Measures satisfied - POCT urinalysis dipstick  2. Essential (primary) hypertension controlled - Comprehensive metabolic panel - TSH  3. Dyslipidemia Continue healthy diet - Lipid panel  4. Addison anemia Continue B12 injections - CBC with Differential/Platelet  5. Colon cancer screening Due for screening colonoscopy - Ambulatory referral to Gastroenterology  6. Insomnia, unspecified type Low dose trazodone  7. Need for pneumococcal vaccination - Pneumococcal polysaccharide vaccine 23-valent greater than or equal to 2yo subcutaneous/IM   Meds ordered this encounter  Medications  . losartan-hydrochlorothiazide (HYZAAR) 100-25 MG tablet    Sig: Take 1 tablet by mouth daily.    Dispense:  30 tablet    Refill:  11    C  . traZODone (DESYREL) 50 MG tablet    Sig: Take 0.5-1 tablets (25-50 mg total) by mouth at bedtime.    Dispense:  30 tablet    Refill:  Bennett, MD [facility]  04/06/2017

## 2017-04-06 NOTE — Patient Instructions (Signed)
Health Maintenance  Topic Date Due  . MAMMOGRAM  02/21/1999  . COLONOSCOPY  12/27/2010  . PNA vac Low Risk Adult (2 of 2 - PPSV23) 03/18/2017  . Hepatitis C Screening  03/18/2021 (Originally 11-12-1951)  . INFLUENZA VACCINE  07/26/2017  . TETANUS/TDAP  06/27/2023  . DEXA SCAN  Addressed

## 2017-04-07 LAB — COMPREHENSIVE METABOLIC PANEL
A/G RATIO: 1.8 (ref 1.2–2.2)
ALT: 19 IU/L (ref 0–32)
AST: 20 IU/L (ref 0–40)
Albumin: 4.4 g/dL (ref 3.6–4.8)
Alkaline Phosphatase: 111 IU/L (ref 39–117)
BUN/Creatinine Ratio: 22 (ref 12–28)
BUN: 13 mg/dL (ref 8–27)
Bilirubin Total: 0.5 mg/dL (ref 0.0–1.2)
CALCIUM: 9.5 mg/dL (ref 8.7–10.3)
CO2: 29 mmol/L (ref 18–29)
CREATININE: 0.59 mg/dL (ref 0.57–1.00)
Chloride: 97 mmol/L (ref 96–106)
GFR, EST AFRICAN AMERICAN: 109 mL/min/{1.73_m2} (ref >59)
GFR, EST NON AFRICAN AMERICAN: 94 mL/min/{1.73_m2} (ref >59)
GLOBULIN, TOTAL: 2.5 g/dL (ref 1.5–4.5)
Glucose: 92 mg/dL (ref 65–99)
POTASSIUM: 3.7 mmol/L (ref 3.5–5.2)
SODIUM: 142 mmol/L (ref 134–144)
TOTAL PROTEIN: 6.9 g/dL (ref 6.0–8.5)

## 2017-04-07 LAB — CBC WITH DIFFERENTIAL/PLATELET
BASOS: 0 %
Basophils Absolute: 0 10*3/uL (ref 0.0–0.2)
EOS (ABSOLUTE): 0.2 10*3/uL (ref 0.0–0.4)
EOS: 5 %
HEMATOCRIT: 40.6 % (ref 34.0–46.6)
HEMOGLOBIN: 13.6 g/dL (ref 11.1–15.9)
IMMATURE GRANS (ABS): 0 10*3/uL (ref 0.0–0.1)
IMMATURE GRANULOCYTES: 0 %
Lymphocytes Absolute: 1.2 10*3/uL (ref 0.7–3.1)
Lymphs: 25 %
MCH: 28.9 pg (ref 26.6–33.0)
MCHC: 33.5 g/dL (ref 31.5–35.7)
MCV: 86 fL (ref 79–97)
MONOCYTES: 7 %
Monocytes Absolute: 0.3 10*3/uL (ref 0.1–0.9)
NEUTROS PCT: 63 %
Neutrophils Absolute: 3.1 10*3/uL (ref 1.4–7.0)
Platelets: 251 10*3/uL (ref 150–379)
RBC: 4.7 x10E6/uL (ref 3.77–5.28)
RDW: 14 % (ref 12.3–15.4)
WBC: 4.9 10*3/uL (ref 3.4–10.8)

## 2017-04-07 LAB — LIPID PANEL
CHOL/HDL RATIO: 3.5 ratio (ref 0.0–4.4)
Cholesterol, Total: 248 mg/dL — ABNORMAL HIGH (ref 100–199)
HDL: 71 mg/dL (ref >39)
LDL Calculated: 142 mg/dL — ABNORMAL HIGH (ref 0–99)
Triglycerides: 174 mg/dL — ABNORMAL HIGH (ref 0–149)
VLDL Cholesterol Cal: 35 mg/dL (ref 5–40)

## 2017-04-07 LAB — TSH: TSH: 5.6 u[IU]/mL — AB (ref 0.450–4.500)

## 2017-04-07 NOTE — Progress Notes (Signed)
Added T4 and T3. Awaiting results to call patient.

## 2017-04-11 LAB — SPECIMEN STATUS REPORT

## 2017-04-11 LAB — T4: T4, Total: 7.3 ug/dL (ref 4.5–12.0)

## 2017-04-11 LAB — T3: T3, Total: 105 ng/dL (ref 71–180)

## 2017-04-18 ENCOUNTER — Other Ambulatory Visit: Payer: Self-pay

## 2017-04-18 ENCOUNTER — Telehealth: Payer: Self-pay | Admitting: Gastroenterology

## 2017-04-18 DIAGNOSIS — Z1211 Encounter for screening for malignant neoplasm of colon: Secondary | ICD-10-CM

## 2017-04-18 NOTE — Progress Notes (Unsigned)
Gastroenterology Pre-Procedure Review    PATIENT REVIEW QUESTIONS: The patient responded to the following health history questions as indicated:    1. Are you having any GI issues? no 2. Do you have a personal history of Polyps? yes (self) 3. Do you have a family history of Colon Cancer or Polyps? no 4. Diabetes Mellitus? no 5. Joint replacements in the past 12 months?no 6. Major health problems in the past 3 months?no 7. Any artificial heart valves, MVP, or defibrillator?no    MEDICATIONS & ALLERGIES:    Patient reports the following regarding taking any anticoagulation/antiplatelet therapy:   Plavix, Coumadin, Eliquis, Xarelto, Lovenox, Pradaxa, Brilinta, or Effient? no Aspirin? yes (81 mg)  Patient confirms/reports the following medications:  Current Outpatient Prescriptions  Medication Sig Dispense Refill  . aspirin 81 MG tablet Take 81 mg by mouth 2 (two) times a week.    . cetirizine (ZYRTEC) 10 MG tablet Take 1 tablet by mouth daily.    . cyanocobalamin (,VITAMIN B-12,) 1000 MCG/ML injection INJECT 1ML AS DIRECTED EVERY MONTH 10 mL 0  . Flaxseed, Linseed, (FLAX SEED OIL) 1000 MG CAPS Take by mouth.    Marland Kitchen ibuprofen (ADVIL,MOTRIN) 200 MG tablet Take 200 mg by mouth 2 (two) times daily.    Marland Kitchen ketoconazole (NIZORAL) 2 % shampoo Apply 1 application topically 2 (two) times a week. 120 mL 12  . losartan-hydrochlorothiazide (HYZAAR) 100-25 MG tablet Take 1 tablet by mouth daily. 30 tablet 11  . traZODone (DESYREL) 50 MG tablet Take 0.5-1 tablets (25-50 mg total) by mouth at bedtime. 30 tablet 2   No current facility-administered medications for this visit.     Patient confirms/reports the following allergies:  Allergies  Allergen Reactions  . Calcium Channel Blockers Palpitations  . Loratadine Palpitations    Orders Placed This Encounter  Procedures  . Procedural/ Surgical Case Request: COLONOSCOPY WITH PROPOFOL    Standing Status:   Standing    Number of Occurrences:   1    Order Specific Question:   Pre-op diagnosis    Answer:   z12.11 Screening colonoscopy    Order Specific Question:   CPT Code    Answer:   16945    AUTHORIZATION INFORMATION Primary Insurance: 1D#: Group #:  Secondary Insurance: 1D#: Group #:  SCHEDULE INFORMATION: Date:  Time: Location:

## 2017-04-18 NOTE — Telephone Encounter (Signed)
04/18/17 Per [employer] website NO prior auth required for Colonoscopy [id number] / Z12.11 Decision ID#: [id number]

## 2017-04-28 ENCOUNTER — Other Ambulatory Visit: Payer: Self-pay | Admitting: Internal Medicine

## 2017-06-07 ENCOUNTER — Encounter: Payer: Self-pay | Admitting: *Deleted

## 2017-06-09 NOTE — Discharge Instructions (Signed)
General Anesthesia, Adult, Care After °These instructions provide you with information about caring for yourself after your procedure. Your health care provider may also give you more specific instructions. Your treatment has been planned according to current medical practices, but problems sometimes occur. Call your health care provider if you have any problems or questions after your procedure. °What can I expect after the procedure? °After the procedure, it is common to have: °· Vomiting. °· A sore throat. °· Mental slowness. ° °It is common to feel: °· Nauseous. °· Cold or shivery. °· Sleepy. °· Tired. °· Sore or achy, even in parts of your body where you did not have surgery. ° °Follow these instructions at home: °For at least 24 hours after the procedure: °· Do not: °? Participate in activities where you could fall or become injured. °? Drive. °? Use heavy machinery. °? Drink alcohol. °? Take sleeping pills or medicines that cause drowsiness. °? Make important decisions or sign legal documents. °? Take care of children on your own. °· Rest. °Eating and drinking °· If you vomit, drink water, juice, or soup when you can drink without vomiting. °· Drink enough fluid to keep your urine clear or pale yellow. °· Make sure you have little or no nausea before eating solid foods. °· Follow the diet recommended by your health care provider. °General instructions °· Have a responsible adult stay with you until you are awake and alert. °· Return to your normal activities as told by your health care provider. Ask your health care provider what activities are safe for you. °· Take over-the-counter and prescription medicines only as told by your health care provider. °· If you smoke, do not smoke without supervision. °· Keep all follow-up visits as told by your health care provider. This is important. °Contact a health care provider if: °· You continue to have nausea or vomiting at home, and medicines are not helpful. °· You  cannot drink fluids or start eating again. °· You cannot urinate after 8-12 hours. °· You develop a skin rash. °· You have fever. °· You have increasing redness at the site of your procedure. °Get help right away if: °· You have difficulty breathing. °· You have chest pain. °· You have unexpected bleeding. °· You feel that you are having a life-threatening or urgent problem. °This information is not intended to replace advice given to you by your health care provider. Make sure you discuss any questions you have with your health care provider. °Document Released: 03/20/2001 Document Revised: 05/16/2016 Document Reviewed: 11/26/2015 °Elsevier Interactive Patient Education © 2018 [employer] Inc. ° °

## 2017-06-12 ENCOUNTER — Ambulatory Visit: Payer: Medicare Other | Admitting: Anesthesiology

## 2017-06-12 ENCOUNTER — Encounter
Admission: RE | Disposition: A | Discharge status: Home / Self Care | Payer: Self-pay | Source: Ambulatory Visit | Attending: Gastroenterology

## 2017-06-12 ENCOUNTER — Ambulatory Visit
Admission: RE | Admit: 2017-06-12 | Discharge: 2017-06-12 | Disposition: A | Discharge status: Home / Self Care | Payer: Medicare Other | Source: Ambulatory Visit | Attending: Gastroenterology | Admitting: Gastroenterology

## 2017-06-12 DIAGNOSIS — Z888 Allergy status to other drugs, medicaments and biological substances status: Secondary | ICD-10-CM | POA: Diagnosis not present

## 2017-06-12 DIAGNOSIS — Z79899 Other long term (current) drug therapy: Secondary | ICD-10-CM | POA: Diagnosis not present

## 2017-06-12 DIAGNOSIS — Z8249 Family history of ischemic heart disease and other diseases of the circulatory system: Secondary | ICD-10-CM | POA: Insufficient documentation

## 2017-06-12 DIAGNOSIS — Z1211 Encounter for screening for malignant neoplasm of colon: Secondary | ICD-10-CM | POA: Insufficient documentation

## 2017-06-12 DIAGNOSIS — Z7982 Long term (current) use of aspirin: Secondary | ICD-10-CM | POA: Insufficient documentation

## 2017-06-12 DIAGNOSIS — I10 Essential (primary) hypertension: Secondary | ICD-10-CM | POA: Diagnosis not present

## 2017-06-12 DIAGNOSIS — E538 Deficiency of other specified B group vitamins: Secondary | ICD-10-CM | POA: Diagnosis not present

## 2017-06-12 HISTORY — DX: Essential (primary) hypertension: I10

## 2017-06-12 HISTORY — DX: Deficiency of other specified B group vitamins: E53.8

## 2017-06-12 HISTORY — PX: COLONOSCOPY WITH PROPOFOL: SHX5780

## 2017-06-12 SURGERY — COLONOSCOPY WITH PROPOFOL
Anesthesia: General | Wound class: Contaminated

## 2017-06-12 MED ORDER — LIDOCAINE HCL (CARDIAC) 20 MG/ML IV SOLN
INTRAVENOUS | Status: DC | PRN
  Administered 2017-06-12: 50 mg via INTRAVENOUS

## 2017-06-12 MED ORDER — LACTATED RINGERS IV SOLN
INTRAVENOUS | Status: DC
Start: 2017-06-12 — End: 2017-06-12
  Administered 2017-06-12: 12:00:00 via INTRAVENOUS

## 2017-06-12 MED ORDER — STERILE WATER FOR IRRIGATION IR SOLN
Status: DC | PRN
  Administered 2017-06-12: 12:00:00

## 2017-06-12 MED ORDER — PROPOFOL 10 MG/ML IV BOLUS
INTRAVENOUS | Status: DC | PRN
  Administered 2017-06-12: 50 mg via INTRAVENOUS
  Administered 2017-06-12: 100 mg via INTRAVENOUS
  Administered 2017-06-12 (×3): 50 mg via INTRAVENOUS

## 2017-06-12 SURGICAL SUPPLY — 23 items

## 2017-06-12 NOTE — Anesthesia Procedure Notes (Signed)
Performed by: Malori Myers Pre-anesthesia Checklist: Patient identified, Emergency Drugs available, Suction available, Timeout performed and Patient being monitored Patient Re-evaluated:Patient Re-evaluated prior to induction Oxygen Delivery Method: Nasal cannula Placement Confirmation: positive ETCO2       

## 2017-06-12 NOTE — Transfer of Care (Signed)
Immediate Anesthesia Transfer of Care Note  Patient: Shelly Fischer  Procedure(s) Performed: Procedure(s): COLONOSCOPY WITH PROPOFOL (N/A)  Patient Location: PACU  Anesthesia Type: General  Level of Consciousness: awake, alert  and patient cooperative  Airway and Oxygen Therapy: Patient Spontanous Breathing and Patient connected to supplemental oxygen  Post-op Assessment: Post-op Vital signs reviewed, Patient's Cardiovascular Status Stable, Respiratory Function Stable, Patent Airway and No signs of Nausea or vomiting  Post-op Vital Signs: Reviewed and stable  Complications: No apparent anesthesia complications

## 2017-06-12 NOTE — Anesthesia Preprocedure Evaluation (Signed)
Anesthesia Evaluation  Patient identified by MRN, date of birth, ID band Patient awake    Reviewed: Allergy & Precautions, H&P , NPO status , Patient's Chart, lab work & pertinent test results  Airway Mallampati: II  TM Distance: >3 FB Neck ROM: full    Dental no notable dental hx.    Pulmonary neg pulmonary ROS,    Pulmonary exam normal        Cardiovascular hypertension, On Medications Normal cardiovascular exam     Neuro/Psych    GI/Hepatic negative GI ROS, Neg liver ROS,   Endo/Other  negative endocrine ROS  Renal/GU negative Renal ROS     Musculoskeletal   Abdominal   Peds  Hematology negative hematology ROS (+)   Anesthesia Other Findings   Reproductive/Obstetrics                             Anesthesia Physical Anesthesia Plan  ASA: II  Anesthesia Plan: General   Post-op Pain Management:    Induction:   PONV Risk Score and Plan:   Airway Management Planned:   Additional Equipment:   Intra-op Plan:   Post-operative Plan:   Informed Consent:   Plan Discussed with:   Anesthesia Plan Comments:         Anesthesia Quick Evaluation

## 2017-06-12 NOTE — Op Note (Signed)
[facility] Patient Name: Shelly Fischer Procedure Date: 06/12/2017 11:57 AM MRN: [id number] Account #: [id number] Date of Birth: March 20, 1951 Admit Type: Outpatient Age: 66 Room: [facility] OR ROOM 01 Gender: Female Note Status: Finalized Procedure:            Colonoscopy Indications:          Screening for colorectal malignant neoplasm Providers:            Lucilla Lame MD, MD Referring MD:         Halina Maidens, MD (Referring MD) Medicines:            Propofol per Anesthesia Complications:        No immediate complications. Procedure:            Pre-Anesthesia Assessment:                       - Prior to the procedure, a History and Physical was                        performed, and patient medications and allergies were                        reviewed. The patient's tolerance of previous                        anesthesia was also reviewed. The risks and benefits of                        the procedure and the sedation options and risks were                        discussed with the patient. All questions were                        answered, and informed consent was obtained. Prior                        Anticoagulants: The patient has taken no previous                        anticoagulant or antiplatelet agents. ASA Grade                        Assessment: II - A patient with mild systemic disease.                        After reviewing the risks and benefits, the patient was                        deemed in satisfactory condition to undergo the                        procedure.                       After obtaining informed consent, the colonoscope was                        passed under direct vision. Throughout the procedure,  the patient's blood pressure, pulse, and oxygen                        saturations were monitored continuously. The Olympus                        CF-HQ190L Colonoscope (S#. [id number]) was introduced                     through the anus and advanced to the the cecum,                        identified by appendiceal orifice and ileocecal valve.                        The colonoscopy was performed without difficulty. The                        patient tolerated the procedure well. The quality of                        the bowel preparation was excellent. Findings:      The perianal and digital rectal examinations were normal.      The entire examined colon appeared normal. Impression:           - The entire examined colon is normal.                       - No specimens collected. Recommendation:       - Discharge patient to home.                       - Resume previous diet.                       - Continue present medications.                       - Repeat colonoscopy in 10 years for screening unless                        any change in family history or lower GI problems. Procedure Code(s):    --- Professional ---                       [phone], Colonoscopy, flexible; diagnostic, including                        collection of specimen(s) by brushing or washing, when                        performed (separate procedure) Diagnosis Code(s):    --- Professional ---                       Z12.11, Encounter for screening for malignant neoplasm                        of colon CPT copyright 2016 American Medical Association. All rights reserved. The codes documented in this report are preliminary and upon coder review may  be revised to meet current compliance requirements. Lucilla Lame MD, MD 06/12/2017 12:16:14 PM  This report has been signed electronically. Number of Addenda: 0 Note Initiated On: 06/12/2017 11:57 AM Scope Withdrawal Time: 0 hours 6 minutes 7 seconds  Total Procedure Duration: 0 hours 11 minutes 27 seconds       [facility]

## 2017-06-12 NOTE — Anesthesia Postprocedure Evaluation (Signed)
Anesthesia Post Note  Patient: BESAN KETCHEM  Procedure(s) Performed: Procedure(s) (LRB): COLONOSCOPY WITH PROPOFOL (N/A)  Patient location during evaluation: PACU Anesthesia Type: General Level of consciousness: awake and alert Pain management: pain level controlled Vital Signs Assessment: post-procedure vital signs reviewed and stable Respiratory status: spontaneous breathing Cardiovascular status: blood pressure returned to baseline Postop Assessment: no headache Anesthetic complications: no    Jaci Standard, III,  Arpi Diebold D

## 2017-06-12 NOTE — H&P (Signed)
   Lucilla Lame, MD [facility] [address] [place], [place] [address] Phone: [phone] Fax : [phone]  Primary Care Physician:  Glean Hess, MD Primary Gastroenterologist:  Dr. Allen Norris  Pre-Procedure History & Physical: HPI:  Shelly Fischer is a 66 y.o. female is here for a screening colonoscopy.   Past Medical History:  Diagnosis Date  . Hypertension   . Vitamin B12 deficiency     Past Surgical History:  Procedure Laterality Date  . ABDOMINAL HYSTERECTOMY  1990   endometriosis  . FLEXIBLE SIGMOIDOSCOPY      Prior to Admission medications   Medication Sig Start Date End Date Taking? Authorizing Provider  aspirin 81 MG tablet Take 81 mg by mouth 2 (two) times a week.   Yes [provider]  cetirizine (ZYRTEC) 10 MG tablet Take 1 tablet by mouth daily.   Yes [provider]  cyanocobalamin (,VITAMIN B-12,) 1000 MCG/ML injection INJECT 1 ML (1,000 MCG TOTAL) INTO THE MUSCLE EVERY 30 (THIRTY) DAYS. 04/28/17  Yes Glean Hess, MD  Flaxseed, Linseed, (FLAX SEED OIL) 1000 MG CAPS Take by mouth.   Yes [provider]  ibuprofen (ADVIL,MOTRIN) 200 MG tablet Take 200 mg by mouth 2 (two) times daily.   Yes [provider]  ketoconazole (NIZORAL) 2 % shampoo Apply 1 application topically 2 (two) times a week. 12/30/15  Yes Glean Hess, MD  losartan-hydrochlorothiazide (HYZAAR) 100-25 MG tablet Take 1 tablet by mouth daily. 04/06/17  Yes Glean Hess, MD  traZODone (DESYREL) 50 MG tablet Take 0.5-1 tablets (25-50 mg total) by mouth at bedtime. Patient not taking: Reported on 06/07/2017 04/06/17   Glean Hess, MD    Allergies as of 04/18/2017 - Review Complete 04/06/2017  Allergen Reaction Noted  . Calcium channel blockers Palpitations 11/21/2015  . Loratadine Palpitations 11/21/2015    Family History  Problem Relation Age of Onset  . CAD Mother 58  . CAD Brother 50    Social History   Social History  . Marital  status: Unknown    Spouse name: N/A  . Number of children: N/A  . Years of education: N/A   Occupational History  . Not on file.   Social History Main Topics  . Smoking status: Never Smoker  . Smokeless tobacco: Never Used  . Alcohol use No  . Drug use: No  . Sexual activity: Not on file   Other Topics Concern  . Not on file   Social History Narrative  . No narrative on file    Review of Systems: See HPI, otherwise negative ROS  Physical Exam: BP (!) 147/61   Pulse 75   Temp 97 F (36.1 C) (Temporal)   Resp 16   Ht 5\' 3"  (1.6 m)   Wt 147 lb (66.7 kg)   SpO2 98%   BMI 26.04 kg/m  General:   Alert,  pleasant and cooperative in NAD Head:  Normocephalic and atraumatic. Neck:  Supple; no masses or thyromegaly. Lungs:  Clear throughout to auscultation.    Heart:  Regular rate and rhythm. Abdomen:  Soft, nontender and nondistended. Normal bowel sounds, without guarding, and without rebound.   Neurologic:  Alert and  oriented x4;  grossly normal neurologically.  Impression/Plan: Shelly Fischer is now here to undergo a screening colonoscopy.  Risks, benefits, and alternatives regarding colonoscopy have been reviewed with the patient.  Questions have been answered.  All parties agreeable.

## 2017-06-13 ENCOUNTER — Encounter: Payer: Self-pay | Admitting: Gastroenterology

## 2017-08-25 ENCOUNTER — Other Ambulatory Visit: Payer: Self-pay | Admitting: Internal Medicine

## 2017-11-24 ENCOUNTER — Other Ambulatory Visit: Payer: Self-pay | Admitting: Internal Medicine

## 2017-11-24 MED ORDER — CYANOCOBALAMIN 1000 MCG/ML IJ SOLN: INTRAMUSCULAR | 3 refills | Status: DC

## 2018-03-20 ENCOUNTER — Other Ambulatory Visit: Payer: Self-pay | Admitting: Internal Medicine

## 2018-03-20 MED ORDER — LOSARTAN POTASSIUM 100 MG PO TABS: 100.0000 mg | ORAL_TABLET | Freq: Every day | ORAL | 5 refills | Status: DC

## 2018-03-20 MED ORDER — HYDROCHLOROTHIAZIDE 25 MG PO TABS: 25.0000 mg | ORAL_TABLET | Freq: Every day | ORAL | 5 refills | Status: DC

## 2018-03-23 ENCOUNTER — Telehealth: Payer: Self-pay | Admitting: Internal Medicine

## 2018-03-23 NOTE — Telephone Encounter (Signed)
See notes on appt for 4/4- appt sched too early - knb

## 2018-03-29 ENCOUNTER — Ambulatory Visit: Payer: Medicare Other

## 2018-04-19 ENCOUNTER — Encounter: Payer: Self-pay | Admitting: Internal Medicine

## 2018-04-19 ENCOUNTER — Ambulatory Visit (INDEPENDENT_AMBULATORY_CARE_PROVIDER_SITE_OTHER): Payer: Medicare Other | Admitting: Internal Medicine

## 2018-04-19 VITALS — BP 132/82 | HR 74 | Ht 63.0 in | Wt 150.0 lb

## 2018-04-19 DIAGNOSIS — Z1231 Encounter for screening mammogram for malignant neoplasm of breast: Secondary | ICD-10-CM

## 2018-04-19 DIAGNOSIS — I10 Essential (primary) hypertension: Secondary | ICD-10-CM | POA: Diagnosis not present

## 2018-04-19 DIAGNOSIS — Z1239 Encounter for other screening for malignant neoplasm of breast: Secondary | ICD-10-CM

## 2018-04-19 DIAGNOSIS — E785 Hyperlipidemia, unspecified: Secondary | ICD-10-CM

## 2018-04-19 DIAGNOSIS — Z0000 Encounter for general adult medical examination without abnormal findings: Secondary | ICD-10-CM

## 2018-04-19 DIAGNOSIS — D510 Vitamin B12 deficiency anemia due to intrinsic factor deficiency: Secondary | ICD-10-CM

## 2018-04-19 LAB — POCT URINALYSIS DIPSTICK
Bilirubin, UA: NEGATIVE
Blood, UA: NEGATIVE
Glucose, UA: NEGATIVE
KETONES UA: NEGATIVE
LEUKOCYTES UA: NEGATIVE
NITRITE UA: NEGATIVE
PH UA: 6 (ref 5.0–8.0)
PROTEIN UA: NEGATIVE
Spec Grav, UA: 1.015 (ref 1.010–1.025)
UROBILINOGEN UA: 0.2 U/dL

## 2018-04-19 NOTE — Patient Instructions (Addendum)
Health Maintenance  Topic Date Due  . MAMMOGRAM  02/21/1999  . Hepatitis C Screening  03/18/2021 (Originally 29-Jun-1951)  . INFLUENZA VACCINE  07/26/2018  . TETANUS/TDAP  06/27/2023  . COLONOSCOPY  06/13/2027  . PNA vac Low Risk Adult  Completed  . DEXA SCAN  Addressed    DASH Eating Plan DASH stands for "Dietary Approaches to Stop Hypertension." The DASH eating plan is a healthy eating plan that has been shown to reduce high blood pressure (hypertension). It may also reduce your risk for type 2 diabetes, heart disease, and stroke. The DASH eating plan may also help with weight loss. What are tips for following this plan? General guidelines  Avoid eating more than 2,300 mg (milligrams) of salt (sodium) a day. If you have hypertension, you may need to reduce your sodium intake to 1,500 mg a day.  Limit alcohol intake to no more than 1 drink a day for nonpregnant women and 2 drinks a day for men. One drink equals 12 oz of beer, 5 oz of wine, or 1 oz of hard liquor.  Work with your health care provider to maintain a healthy body weight or to lose weight. Ask what an ideal weight is for you.  Get at least 30 minutes of exercise that causes your heart to beat faster (aerobic exercise) most days of the week. Activities may include walking, swimming, or biking.  Work with your health care provider or diet and nutrition specialist (dietitian) to adjust your eating plan to your individual calorie needs. Reading food labels  Check food labels for the amount of sodium per serving. Choose foods with less than 5 percent of the Daily Value of sodium. Generally, foods with less than 300 mg of sodium per serving fit into this eating plan.  To find whole grains, look for the word "whole" as the first word in the ingredient list. Shopping  Buy products labeled as "low-sodium" or "no salt added."  Buy fresh foods. Avoid canned foods and premade or frozen meals. Cooking  Avoid adding salt when  cooking. Use salt-free seasonings or herbs instead of table salt or sea salt. Check with your health care provider or pharmacist before using salt substitutes.  Do not fry foods. Cook foods using healthy methods such as baking, boiling, grilling, and broiling instead.  Cook with heart-healthy oils, such as olive, canola, soybean, or sunflower oil. Meal planning   Eat a balanced diet that includes: ? 5 or more servings of fruits and vegetables each day. At each meal, try to fill half of your plate with fruits and vegetables. ? Up to 6-8 servings of whole grains each day. ? Less than 6 oz of lean meat, poultry, or fish each day. A 3-oz serving of meat is about the same size as a deck of cards. One egg equals 1 oz. ? 2 servings of low-fat dairy each day. ? A serving of nuts, seeds, or beans 5 times each week. ? Heart-healthy fats. Healthy fats called Omega-3 fatty acids are found in foods such as flaxseeds and coldwater fish, like sardines, salmon, and mackerel.  Limit how much you eat of the following: ? Canned or prepackaged foods. ? Food that is high in trans fat, such as fried foods. ? Food that is high in saturated fat, such as fatty meat. ? Sweets, desserts, sugary drinks, and other foods with added sugar. ? Full-fat dairy products.  Do not salt foods before eating.  Try to eat at least 2 vegetarian  meals each week.  Eat more home-cooked food and less restaurant, buffet, and fast food.  When eating at a restaurant, ask that your food be prepared with less salt or no salt, if possible. What foods are recommended? The items listed may not be a complete list. Talk with your dietitian about what dietary choices are best for you. Grains Whole-grain or whole-wheat bread. Whole-grain or whole-wheat pasta. Brown rice. [place]row. Bulgur. Whole-grain and low-sodium cereals. Pita bread. Low-fat, low-sodium crackers. Whole-wheat flour tortillas. Vegetables Fresh or frozen vegetables  (raw, steamed, roasted, or grilled). Low-sodium or reduced-sodium tomato and vegetable juice. Low-sodium or reduced-sodium tomato sauce and tomato paste. Low-sodium or reduced-sodium canned vegetables. Fruits All fresh, dried, or frozen fruit. Canned fruit in natural juice (without added sugar). Meat and other protein foods Skinless chicken or [place]. Ground chicken or [place]. Pork with fat trimmed off. Fish and seafood. Egg whites. Dried beans, peas, or lentils. Unsalted nuts, nut butters, and seeds. Unsalted canned beans. Lean cuts of beef with fat trimmed off. Low-sodium, lean deli meat. Dairy Low-fat (1%) or fat-free (skim) milk. Fat-free, low-fat, or reduced-fat cheeses. Nonfat, low-sodium ricotta or cottage cheese. Low-fat or nonfat yogurt. Low-fat, low-sodium cheese. Fats and oils Soft margarine without trans fats. Vegetable oil. Low-fat, reduced-fat, or light mayonnaise and salad dressings (reduced-sodium). Canola, safflower, olive, soybean, and sunflower oils. Avocado. Seasoning and other foods Herbs. Spices. Seasoning mixes without salt. Unsalted popcorn and pretzels. Fat-free sweets. What foods are not recommended? The items listed may not be a complete list. Talk with your dietitian about what dietary choices are best for you. Grains Baked goods made with fat, such as croissants, muffins, or some breads. Dry pasta or rice meal packs. Vegetables Creamed or fried vegetables. Vegetables in a cheese sauce. Regular canned vegetables (not low-sodium or reduced-sodium). Regular canned tomato sauce and paste (not low-sodium or reduced-sodium). Regular tomato and vegetable juice (not low-sodium or reduced-sodium). Angie Fava. Olives. Fruits Canned fruit in a light or heavy syrup. Fried fruit. Fruit in cream or butter sauce. Meat and other protein foods Fatty cuts of meat. Ribs. Fried meat. Berniece Salines. Sausage. Bologna and other processed lunch meats. Salami. Fatback. Hotdogs. Bratwurst. Salted nuts  and seeds. Canned beans with added salt. Canned or smoked fish. Whole eggs or egg yolks. Chicken or [place] with skin. Dairy Whole or 2% milk, cream, and half-and-half. Whole or full-fat cream cheese. Whole-fat or sweetened yogurt. Full-fat cheese. Nondairy creamers. Whipped toppings. Processed cheese and cheese spreads. Fats and oils Butter. Stick margarine. Lard. Shortening. Ghee. Bacon fat. Tropical oils, such as coconut, palm kernel, or palm oil. Seasoning and other foods Salted popcorn and pretzels. Onion salt, garlic salt, seasoned salt, table salt, and sea salt. Worcestershire sauce. Tartar sauce. Barbecue sauce. Teriyaki sauce. Soy sauce, including reduced-sodium. Steak sauce. Canned and packaged gravies. Fish sauce. Oyster sauce. Cocktail sauce. Horseradish that you find on the shelf. Ketchup. Mustard. Meat flavorings and tenderizers. Bouillon cubes. Hot sauce and Tabasco sauce. Premade or packaged marinades. Premade or packaged taco seasonings. Relishes. Regular salad dressings. Where to find more information:  National Heart, Lung, and [place]: https://wilson-eaton.com/  American Heart Association: www.heart.org Summary  The DASH eating plan is a healthy eating plan that has been shown to reduce high blood pressure (hypertension). It may also reduce your risk for type 2 diabetes, heart disease, and stroke.  With the DASH eating plan, you should limit salt (sodium) intake to 2,300 mg a day. If you have hypertension, you may need to  reduce your sodium intake to 1,500 mg a day.  When on the DASH eating plan, aim to eat more fresh fruits and vegetables, whole grains, lean proteins, low-fat dairy, and heart-healthy fats.  Work with your health care provider or diet and nutrition specialist (dietitian) to adjust your eating plan to your individual calorie needs. This information is not intended to replace advice given to you by your health care provider. Make sure you discuss any questions  you have with your health care provider. Document Released: 12/01/2011 Document Revised: 12/05/2016 Document Reviewed: 12/05/2016 Elsevier Interactive Patient Education  [employer].

## 2018-04-19 NOTE — Progress Notes (Signed)
Patient: Shelly Fischer, Female    DOB: 11/10/1951, 67 y.o.   MRN: [id number] Visit Date: 04/19/2018  Today's Provider: Halina Maidens, MD   Chief Complaint  Patient presents with  . Medicare Wellness    Breast Exam.    Subjective:    Annual wellness visit Shelly Fischer is a 67 y.o. female who presents today for her Subsequent Annual Wellness Visit. She feels well. She reports exercising walking. She reports she is sleeping fairly well. Mammogram is due, she denies breast complaints.  Colonoscopy is up to date. Immunizations are up to date. ----------------------------------------------------------- Hypertension  This is a chronic problem. The problem is unchanged. The problem is controlled. Pertinent negatives include no chest pain, headaches, palpitations or shortness of breath. Past treatments include diuretics and angiotensin blockers. The current treatment provides significant improvement.   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  No issues or concerns  Lipids - on flax seed and healthy diet.  Not interested in statin at this time.  Review of Systems  Constitutional: Negative for chills, fatigue and fever.  HENT: Negative for congestion, hearing loss, tinnitus, trouble swallowing and voice change.   Eyes: Negative for visual disturbance.  Respiratory: Negative for cough, chest tightness, shortness of breath and wheezing.   Cardiovascular: Negative for chest pain, palpitations and leg swelling.  Gastrointestinal: Negative for abdominal pain, constipation, diarrhea and vomiting.  Endocrine: Negative for polydipsia and polyuria.  Genitourinary: Negative for dysuria, frequency, genital sores, vaginal bleeding and vaginal discharge.  Musculoskeletal: Negative for arthralgias, gait problem and joint swelling.  Skin: Negative for color change and rash.  Neurological: Negative for dizziness, tremors, light-headedness and headaches.  Hematological: Negative for  adenopathy. Does not bruise/bleed easily.  Psychiatric/Behavioral: Negative for dysphoric mood and sleep disturbance. The patient is not nervous/anxious.     Social History   Socioeconomic History  . Marital status: Unknown    Spouse name: Not on file  . Number of children: Not on file  . Years of education: Not on file  . Highest education level: Not on file  Occupational History  . Not on file  Social Needs  . Financial resource strain: Not on file  . Food insecurity:    Worry: Not on file    Inability: Not on file  . Transportation needs:    Medical: Not on file    Non-medical: Not on file  Tobacco Use  . Smoking status: Never Smoker  . Smokeless tobacco: Never Used  Substance and Sexual Activity  . Alcohol use: No    Alcohol/week: 0.0 oz  . Drug use: No  . Sexual activity: Not on file  Lifestyle  . Physical activity:    Days per week: Not on file    Minutes per session: Not on file  . Stress: Not on file  Relationships  . Social connections:    Talks on phone: Not on file    Gets together: Not on file    Attends religious service: Not on file    Active member of club or organization: Not on file    Attends meetings of clubs or organizations: Not on file    Relationship status: Not on file  . Intimate partner violence:    Fear of current or ex partner: Not on file    Emotionally abused: Not on file    Physically abused: Not on file    Forced sexual activity: Not on file  Other Topics Concern  .  Not on file  Social History Narrative  . Not on file   Current Meds  Medication Sig  . cetirizine (ZYRTEC) 10 MG tablet Take 1 tablet by mouth daily.  . cyanocobalamin (,VITAMIN B-12,) 1000 MCG/ML injection INJECT 1 ML (1,000 MCG TOTAL) INTO THE MUSCLE EVERY 30 (THIRTY) DAYS.  . Flaxseed, Linseed, (FLAX SEED OIL) 1000 MG CAPS Take by mouth.  . hydrochlorothiazide (HYDRODIURIL) 25 MG tablet Take 1 tablet (25 mg total) by mouth daily.  Marland Kitchen ibuprofen (ADVIL,MOTRIN) 200  MG tablet Take 200 mg by mouth 2 (two) times daily.  Marland Kitchen ketoconazole (NIZORAL) 2 % shampoo APPLY 1 APPLICATION TOPICALLY 2 (TWO) TIMES A WEEK.  Marland Kitchen losartan (COZAAR) 100 MG tablet Take 1 tablet (100 mg total) by mouth daily.    Patient Active Problem List   Diagnosis Date Noted  . Special screening for malignant neoplasms, colon   . Shoulder pain, right 03/18/2016  . Carpal tunnel syndrome 11/21/2015  . Dyslipidemia 11/21/2015  . Essential (primary) hypertension 11/21/2015  . Hot flash, menopausal 11/21/2015  . Addison anemia 11/21/2015  . Allergic rhinitis, seasonal 11/21/2015    Past Surgical History:  Procedure Laterality Date  . ABDOMINAL HYSTERECTOMY  1990   endometriosis  . COLONOSCOPY WITH PROPOFOL N/A 06/12/2017   Procedure: COLONOSCOPY WITH PROPOFOL;  Surgeon: Lucilla Lame, MD;  Location: [place];  Service: Endoscopy;  Laterality: N/A;  . FLEXIBLE SIGMOIDOSCOPY      Her family history includes CAD (age of onset: 1) in her brother; CAD (age of onset: 56) in her mother.     Current Meds  Medication Sig  . cetirizine (ZYRTEC) 10 MG tablet Take 1 tablet by mouth daily.  . cyanocobalamin (,VITAMIN B-12,) 1000 MCG/ML injection INJECT 1 ML (1,000 MCG TOTAL) INTO THE MUSCLE EVERY 30 (THIRTY) DAYS.  . Flaxseed, Linseed, (FLAX SEED OIL) 1000 MG CAPS Take by mouth.  . hydrochlorothiazide (HYDRODIURIL) 25 MG tablet Take 1 tablet (25 mg total) by mouth daily.  Marland Kitchen ibuprofen (ADVIL,MOTRIN) 200 MG tablet Take 200 mg by mouth 2 (two) times daily.  Marland Kitchen ketoconazole (NIZORAL) 2 % shampoo APPLY 1 APPLICATION TOPICALLY 2 (TWO) TIMES A WEEK.  Marland Kitchen losartan (COZAAR) 100 MG tablet Take 1 tablet (100 mg total) by mouth daily.    Patient Care Team: Glean Hess, MD as PCP - General (Internal Medicine)       Objective:   Vitals: BP 132/82   Pulse 74   Ht 5\' 3"  (1.6 m)   Wt 150 lb (68 kg)   SpO2 97%   BMI 26.57 kg/m   Physical Exam  Constitutional: She is oriented to  person, place, and time. She appears well-developed and well-nourished. No distress.  HENT:  Head: Normocephalic and atraumatic.  Right Ear: Tympanic membrane and ear canal normal.  Left Ear: Tympanic membrane and ear canal normal.  Nose: Right sinus exhibits no maxillary sinus tenderness. Left sinus exhibits no maxillary sinus tenderness.  Mouth/Throat: Uvula is midline and oropharynx is clear and moist.  Eyes: Conjunctivae and EOM are normal. Right eye exhibits no discharge. Left eye exhibits no discharge. No scleral icterus.  Neck: Normal range of motion. Carotid bruit is not present. No erythema present. No thyromegaly present.  Cardiovascular: Normal rate, regular rhythm, normal heart sounds and normal pulses.  Pulmonary/Chest: Effort normal. No respiratory distress. She has no wheezes. Right breast exhibits no mass, no nipple discharge, no skin change and no tenderness. Left breast exhibits no mass, no nipple discharge, no  skin change and no tenderness.  Abdominal: Soft. Bowel sounds are normal. There is no hepatosplenomegaly. There is no tenderness. There is no CVA tenderness.  Musculoskeletal: Normal range of motion.  Lymphadenopathy:    She has no cervical adenopathy.    She has no axillary adenopathy.  Neurological: She is alert and oriented to person, place, and time. She has normal reflexes. No cranial nerve deficit or sensory deficit.  Skin: Skin is warm, dry and intact. No rash noted.  Psychiatric: She has a normal mood and affect. Her speech is normal and behavior is normal. Thought content normal.  Nursing note and vitals reviewed.   Activities of Daily Living In your present state of health, do you have any difficulty performing the following activities: 04/19/2018 04/19/2018  Hearing? N N  Vision? N N  Difficulty concentrating or making decisions? N N  Walking or climbing stairs? N N  Dressing or bathing? N N  Doing errands, shopping? N N  Preparing Food and eating ? N -   Using the Toilet? N -  In the past six months, have you accidently leaked urine? N -  Do you have problems with loss of bowel control? N -  Managing your Medications? N -  Managing your Finances? N -  Housekeeping or managing your Housekeeping? N -  Some recent data might be hidden    Fall Risk Assessment Fall Risk  04/19/2018 04/06/2017 03/18/2016  Falls in the past year? No No No     Depression Screen PHQ 2/9 Scores 04/19/2018 04/06/2017 03/18/2016  PHQ - 2 Score 0 0 0    6CIT Screen 04/19/2018 04/06/2017  What Year? 0 points 0 points  What month? 0 points 0 points  What time? 0 points 0 points  Count back from 20 0 points 2 points  Months in reverse 0 points 0 points  Repeat phrase 2 points 0 points  Total Score 2 2    Medicare Annual Wellness Visit Summary:  Reviewed patient's Family Medical History Reviewed and updated list of patient's medical providers Assessment of cognitive impairment was done Assessed patient's functional ability Established a written schedule for health screening [place] Completed and Reviewed  Exercise Activities and Dietary recommendations Goals    None      Immunization History  Administered Date(s) Administered  . Influenza, High Dose Seasonal PF 10/19/2017  . Pneumococcal Conjugate-13 03/18/2016  . Pneumococcal Polysaccharide-23 04/06/2017  . Tdap 06/26/2013    Health Maintenance  Topic Date Due  . MAMMOGRAM  02/21/1999  . Hepatitis C Screening  03/18/2021 (Originally 1951/07/15)  . INFLUENZA VACCINE  07/26/2018  . TETANUS/TDAP  06/27/2023  . COLONOSCOPY  06/13/2027  . PNA vac Low Risk Adult  Completed  . DEXA SCAN  Addressed    Discussed health benefits of physical activity, and encouraged her to engage in regular exercise appropriate for her age and condition.    ------------------------------------------------------------------------------------------------------------  Assessment & Plan:  1.  Medicare annual wellness visit, subsequent Measures satisfied - POCT urinalysis dipstick  2. Essential (primary) hypertension controlled - CBC with Differential/Platelet - Comprehensive metabolic panel - TSH  3. Dyslipidemia Check labs  Continue healthy diet, exercise and flax seed - Lipid panel  4. Addison anemia Continue B12 injections monthly - CBC with Differential/Platelet  5. Encounter for special screening examination for neoplasm of breast - MM DIGITAL SCREENING BILATERAL; Future   No orders of the defined types were placed in this encounter.   Partially dictated using  Editor, commissioning. Any errors are unintentional.  Halina Maidens, MD [facility]  04/19/2018

## 2018-04-20 LAB — CBC WITH DIFFERENTIAL/PLATELET
BASOS: 1 %
Basophils Absolute: 0 10*3/uL (ref 0.0–0.2)
EOS (ABSOLUTE): 0.2 10*3/uL (ref 0.0–0.4)
EOS: 4 %
HEMATOCRIT: 42.4 % (ref 34.0–46.6)
Hemoglobin: 13.7 g/dL (ref 11.1–15.9)
Immature Grans (Abs): 0 10*3/uL (ref 0.0–0.1)
Immature Granulocytes: 0 %
LYMPHS ABS: 1.3 10*3/uL (ref 0.7–3.1)
Lymphs: 29 %
MCH: 28.8 pg (ref 26.6–33.0)
MCHC: 32.3 g/dL (ref 31.5–35.7)
MCV: 89 fL (ref 79–97)
MONOS ABS: 0.4 10*3/uL (ref 0.1–0.9)
Monocytes: 8 %
Neutrophils Absolute: 2.7 10*3/uL (ref 1.4–7.0)
Neutrophils: 58 %
Platelets: 289 10*3/uL (ref 150–379)
RBC: 4.75 x10E6/uL (ref 3.77–5.28)
RDW: 14.1 % (ref 12.3–15.4)
WBC: 4.6 10*3/uL (ref 3.4–10.8)

## 2018-04-20 LAB — COMPREHENSIVE METABOLIC PANEL
A/G RATIO: 2.2 (ref 1.2–2.2)
ALK PHOS: 121 IU/L — AB (ref 39–117)
ALT: 19 IU/L (ref 0–32)
AST: 16 IU/L (ref 0–40)
Albumin: 4.9 g/dL — ABNORMAL HIGH (ref 3.6–4.8)
BUN/Creatinine Ratio: 18 (ref 12–28)
BUN: 13 mg/dL (ref 8–27)
Bilirubin Total: 0.5 mg/dL (ref 0.0–1.2)
CO2: 30 mmol/L — ABNORMAL HIGH (ref 20–29)
Calcium: 9.9 mg/dL (ref 8.7–10.3)
Chloride: 95 mmol/L — ABNORMAL LOW (ref 96–106)
Creatinine, Ser: 0.73 mg/dL (ref 0.57–1.00)
GFR calc Af Amer: 97 mL/min/{1.73_m2} (ref >59)
GFR, EST NON AFRICAN AMERICAN: 84 mL/min/{1.73_m2} (ref >59)
GLOBULIN, TOTAL: 2.2 g/dL (ref 1.5–4.5)
Glucose: 97 mg/dL (ref 65–99)
POTASSIUM: 4 mmol/L (ref 3.5–5.2)
Sodium: 140 mmol/L (ref 134–144)
Total Protein: 7.1 g/dL (ref 6.0–8.5)

## 2018-04-20 LAB — LIPID PANEL
CHOL/HDL RATIO: 3.5 ratio (ref 0.0–4.4)
CHOLESTEROL TOTAL: 241 mg/dL — AB (ref 100–199)
HDL: 68 mg/dL (ref >39)
LDL Calculated: 142 mg/dL — ABNORMAL HIGH (ref 0–99)
TRIGLYCERIDES: 157 mg/dL — AB (ref 0–149)
VLDL Cholesterol Cal: 31 mg/dL (ref 5–40)

## 2018-04-20 LAB — TSH: TSH: 6.81 u[IU]/mL — ABNORMAL HIGH (ref 0.450–4.500)

## 2018-04-23 LAB — T4: T4, Total: 6.7 ug/dL (ref 4.5–12.0)

## 2018-04-23 LAB — T3: T3, Total: 128 ng/dL (ref 71–180)

## 2018-04-23 LAB — SPECIMEN STATUS REPORT

## 2018-06-12 ENCOUNTER — Other Ambulatory Visit: Payer: Self-pay | Admitting: Internal Medicine

## 2018-09-10 ENCOUNTER — Telehealth: Payer: Self-pay

## 2018-09-10 ENCOUNTER — Encounter: Payer: Self-pay | Admitting: Internal Medicine

## 2018-09-10 NOTE — Telephone Encounter (Signed)
Needs to schedule DEXA/Bone Density if not done yet by Ortho per [facility] and [facility]. Please see if patient can do this and if so we can set it up and let her know when. LMTCB

## 2018-09-24 NOTE — Telephone Encounter (Signed)
Left 2 detailed messages

## 2018-11-07 ENCOUNTER — Other Ambulatory Visit: Payer: Self-pay | Admitting: Internal Medicine

## 2018-12-08 ENCOUNTER — Other Ambulatory Visit: Payer: Self-pay | Admitting: Internal Medicine

## 2018-12-17 ENCOUNTER — Other Ambulatory Visit: Payer: Self-pay | Admitting: Internal Medicine

## 2018-12-17 DIAGNOSIS — I10 Essential (primary) hypertension: Secondary | ICD-10-CM

## 2018-12-17 MED ORDER — LOSARTAN POTASSIUM 100 MG PO TABS: 100.0000 mg | ORAL_TABLET | Freq: Every day | ORAL | 5 refills | Status: DC

## 2018-12-17 MED ORDER — HYDROCHLOROTHIAZIDE 25 MG PO TABS: 25.0000 mg | ORAL_TABLET | Freq: Every day | ORAL | 5 refills | Status: DC

## 2019-04-22 ENCOUNTER — Encounter: Payer: Medicare Other | Admitting: Internal Medicine

## 2019-07-09 ENCOUNTER — Ambulatory Visit: Payer: Medicare Other | Admitting: Internal Medicine

## 2019-07-09 ENCOUNTER — Encounter: Payer: Self-pay | Admitting: Internal Medicine

## 2019-07-09 ENCOUNTER — Other Ambulatory Visit: Payer: Self-pay

## 2019-07-09 VITALS — BP 134/62 | HR 85 | Ht 63.0 in | Wt 147.0 lb

## 2019-07-09 DIAGNOSIS — I10 Essential (primary) hypertension: Secondary | ICD-10-CM

## 2019-07-09 DIAGNOSIS — Z0000 Encounter for general adult medical examination without abnormal findings: Secondary | ICD-10-CM

## 2019-07-09 DIAGNOSIS — Z1231 Encounter for screening mammogram for malignant neoplasm of breast: Secondary | ICD-10-CM | POA: Diagnosis not present

## 2019-07-09 LAB — POCT URINALYSIS DIPSTICK
Blood, UA: NEGATIVE
Glucose, UA: NEGATIVE
Leukocytes, UA: NEGATIVE
Nitrite, UA: NEGATIVE
Protein, UA: NEGATIVE
Spec Grav, UA: 1.015 (ref 1.010–1.025)
Urobilinogen, UA: 0.2 E.U./dL
pH, UA: 6 (ref 5.0–8.0)

## 2019-07-09 MED ORDER — HYDROCHLOROTHIAZIDE 25 MG PO TABS: 25.0000 mg | ORAL_TABLET | Freq: Every day | ORAL | 3 refills | Status: DC

## 2019-07-09 MED ORDER — LOSARTAN POTASSIUM 100 MG PO TABS: 100.0000 mg | ORAL_TABLET | Freq: Every day | ORAL | 3 refills | Status: DC

## 2019-07-09 NOTE — Progress Notes (Signed)
Date:  07/09/2019   Name:  Shelly Fischer   DOB:  09/29/1951   MRN:  [id number]   Chief Complaint: Annual Exam (Breast Exam.) Shelly Fischer is a 68 y.o. female who presents today for her Complete Annual Exam. She feels well. She reports exercising walking regularly. She reports she is sleeping well. She denies breast complaints. She recently moved and her husband has retired.  She no longer has to care for her elderly mother in law who passed away.  Overall more relaxed and comfortable.  Colonoscopy  05/2017 Mammogram - none Immunizations up to date  Hypertension This is a chronic problem. The problem is controlled. Pertinent negatives include no chest pain, headaches, palpitations or shortness of breath. Past treatments include angiotensin blockers. The current treatment provides significant improvement. There are no compliance problems.    Lab Results  Component Value Date   CREATININE 0.73 04/19/2018   BUN 13 04/19/2018   NA 140 04/19/2018   K 4.0 04/19/2018   CL 95 (L) 04/19/2018   CO2 30 (H) 04/19/2018   Lab Results  Component Value Date   CHOL 241 (H) 04/19/2018   HDL 68 04/19/2018   LDLCALC 142 (H) 04/19/2018   TRIG 157 (H) 04/19/2018   CHOLHDL 3.5 04/19/2018   Lab Results  Component Value Date   TSH 6.810 (H) 04/19/2018     Review of Systems  Constitutional: Negative for chills, fatigue and fever.  HENT: Negative for congestion, hearing loss, tinnitus, trouble swallowing and voice change.   Eyes: Negative for visual disturbance.  Respiratory: Negative for cough, chest tightness, shortness of breath and wheezing.   Cardiovascular: Negative for chest pain, palpitations and leg swelling.  Gastrointestinal: Negative for abdominal pain, constipation, diarrhea and vomiting.  Endocrine: Negative for polydipsia and polyuria.  Genitourinary: Negative for dysuria, frequency, genital sores, vaginal bleeding and vaginal discharge.  Musculoskeletal: Negative for  arthralgias, gait problem and joint swelling.  Skin: Negative for color change and rash.  Neurological: Negative for dizziness, tremors, light-headedness and headaches.  Hematological: Negative for adenopathy. Does not bruise/bleed easily.  Psychiatric/Behavioral: Negative for dysphoric mood and sleep disturbance. The patient is not nervous/anxious.     Patient Active Problem List   Diagnosis Date Noted  . Special screening for malignant neoplasms, colon   . Humerus fracture 03/18/2016  . Carpal tunnel syndrome 11/21/2015  . Dyslipidemia 11/21/2015  . Essential (primary) hypertension 11/21/2015  . Hot flash, menopausal 11/21/2015  . Addison anemia 11/21/2015  . Allergic rhinitis, seasonal 11/21/2015    Allergies  Allergen Reactions  . Calcium Channel Blockers Palpitations  . Loratadine Palpitations    Past Surgical History:  Procedure Laterality Date  . ABDOMINAL HYSTERECTOMY  1990   endometriosis  . COLONOSCOPY WITH PROPOFOL N/A 06/12/2017   Procedure: COLONOSCOPY WITH PROPOFOL;  Surgeon: Lucilla Lame, MD;  Location: [facility];  Service: Endoscopy;  Laterality: N/A;  . FLEXIBLE SIGMOIDOSCOPY      Social History   Tobacco Use  . Smoking status: Never Smoker  . Smokeless tobacco: Never Used  Substance Use Topics  . Alcohol use: No    Alcohol/week: 0.0 standard drinks  . Drug use: No     Medication list has been reviewed and updated.  Current Meds  Medication Sig  . cetirizine (ZYRTEC) 10 MG tablet Take 1 tablet by mouth daily.  . cyanocobalamin (,VITAMIN B-12,) 1000 MCG/ML injection INJECT 1 ML (1,000 MCG TOTAL) INTO THE MUSCLE EVERY 30 (THIRTY) DAYS.  Marland Kitchen  Flaxseed, Linseed, (FLAX SEED OIL) 1000 MG CAPS Take 750 mg by mouth.   . hydrochlorothiazide (HYDRODIURIL) 25 MG tablet Take 1 tablet (25 mg total) by mouth daily.  Marland Kitchen ketoconazole (NIZORAL) 2 % shampoo APPLY 1 APPLICATION TOPICALLY 2 (TWO) TIMES A WEEK.  Marland Kitchen losartan (COZAAR) 100 MG tablet Take 1 tablet  (100 mg total) by mouth daily.    PHQ 2/9 Scores 07/09/2019 04/19/2018 04/06/2017 03/18/2016  PHQ - 2 Score 0 0 0 0  PHQ- 9 Score 0 - - -    BP Readings from Last 3 Encounters:  07/09/19 134/62  04/19/18 132/82  06/12/17 121/61    Physical Exam Vitals signs and nursing note reviewed.  Constitutional:      General: She is not in acute distress.    Appearance: She is well-developed.  HENT:     Head: Normocephalic and atraumatic.     Right Ear: Tympanic membrane and ear canal normal.     Left Ear: Tympanic membrane and ear canal normal.     Nose:     Right Sinus: No maxillary sinus tenderness.     Left Sinus: No maxillary sinus tenderness.  Eyes:     General: No scleral icterus.       Right eye: No discharge.        Left eye: No discharge.     Conjunctiva/sclera: Conjunctivae normal.  Neck:     Musculoskeletal: Normal range of motion. No erythema.     Thyroid: No thyromegaly.     Vascular: No carotid bruit.  Cardiovascular:     Rate and Rhythm: Normal rate and regular rhythm.     Pulses: Normal pulses.     Heart sounds: Normal heart sounds.  Pulmonary:     Effort: Pulmonary effort is normal. No respiratory distress.     Breath sounds: No wheezing.  Chest:     Breasts:        Right: No mass, nipple discharge, skin change or tenderness.        Left: No mass, nipple discharge, skin change or tenderness.  Abdominal:     General: Bowel sounds are normal.     Palpations: Abdomen is soft.     Tenderness: There is no abdominal tenderness.  Musculoskeletal: Normal range of motion.  Lymphadenopathy:     Cervical: No cervical adenopathy.  Skin:    General: Skin is warm and dry.     Findings: No rash.  Neurological:     Mental Status: She is alert and oriented to person, place, and time.     Cranial Nerves: No cranial nerve deficit.     Sensory: No sensory deficit.     Deep Tendon Reflexes: Reflexes are normal and symmetric.  Psychiatric:        Attention and Perception:  Attention normal.        Mood and Affect: Mood normal.        Speech: Speech normal.        Behavior: Behavior normal.        Thought Content: Thought content normal.     Wt Readings from Last 3 Encounters:  07/09/19 147 lb (66.7 kg)  04/19/18 150 lb (68 kg)  06/12/17 147 lb (66.7 kg)    BP 134/62   Pulse 85   Ht 5\' 3"  (1.6 m)   Wt 147 lb (66.7 kg)   SpO2 98%   BMI 26.04 kg/m   Assessment and Plan: 1. Annual physical exam Normal exam - Lipid panel -  POCT urinalysis dipstick  2. Encounter for screening mammogram for breast cancer Schedule at [facility]  3. Essential (primary) hypertension controlled - TSH + free T4 - Comprehensive metabolic panel - CBC with Differential/Platelet - losartan (COZAAR) 100 MG tablet; Take 1 tablet (100 mg total) by mouth daily.  Dispense: 90 tablet; Refill: 3 - hydrochlorothiazide (HYDRODIURIL) 25 MG tablet; Take 1 tablet (25 mg total) by mouth daily.  Dispense: 90 tablet; Refill: 3   Partially dictated using Editor, commissioning. Any errors are unintentional.  Halina Maidens, MD [facility]  07/09/2019

## 2019-07-10 LAB — CBC WITH DIFFERENTIAL/PLATELET
Basophils Absolute: 0 10*3/uL (ref 0.0–0.2)
Basos: 1 %
EOS (ABSOLUTE): 0.2 10*3/uL (ref 0.0–0.4)
Eos: 4 %
Hematocrit: 40.9 % (ref 34.0–46.6)
Hemoglobin: 13.6 g/dL (ref 11.1–15.9)
Immature Grans (Abs): 0 10*3/uL (ref 0.0–0.1)
Immature Granulocytes: 0 %
Lymphocytes Absolute: 1.2 10*3/uL (ref 0.7–3.1)
Lymphs: 24 %
MCH: 28.7 pg (ref 26.6–33.0)
MCHC: 33.3 g/dL (ref 31.5–35.7)
MCV: 86 fL (ref 79–97)
Monocytes Absolute: 0.5 10*3/uL (ref 0.1–0.9)
Monocytes: 9 %
Neutrophils Absolute: 3.2 10*3/uL (ref 1.4–7.0)
Neutrophils: 62 %
Platelets: 266 10*3/uL (ref 150–450)
RBC: 4.74 x10E6/uL (ref 3.77–5.28)
RDW: 13.1 % (ref 11.7–15.4)
WBC: 5.1 10*3/uL (ref 3.4–10.8)

## 2019-07-10 LAB — COMPREHENSIVE METABOLIC PANEL
ALT: 20 IU/L (ref 0–32)
AST: 18 IU/L (ref 0–40)
Albumin/Globulin Ratio: 2.1 (ref 1.2–2.2)
Albumin: 4.7 g/dL (ref 3.8–4.8)
Alkaline Phosphatase: 130 IU/L — ABNORMAL HIGH (ref 39–117)
BUN/Creatinine Ratio: 22 (ref 12–28)
BUN: 15 mg/dL (ref 8–27)
Bilirubin Total: 0.4 mg/dL (ref 0.0–1.2)
CO2: 27 mmol/L (ref 20–29)
Calcium: 9.5 mg/dL (ref 8.7–10.3)
Chloride: 95 mmol/L — ABNORMAL LOW (ref 96–106)
Creatinine, Ser: 0.69 mg/dL (ref 0.57–1.00)
GFR calc Af Amer: 102 mL/min/{1.73_m2} (ref >59)
GFR calc non Af Amer: 88 mL/min/{1.73_m2} (ref >59)
Globulin, Total: 2.2 g/dL (ref 1.5–4.5)
Glucose: 87 mg/dL (ref 65–99)
Potassium: 4 mmol/L (ref 3.5–5.2)
Sodium: 138 mmol/L (ref 134–144)
Total Protein: 6.9 g/dL (ref 6.0–8.5)

## 2019-07-10 LAB — TSH+FREE T4
Free T4: 1.02 ng/dL (ref 0.82–1.77)
TSH: 6.59 u[IU]/mL — ABNORMAL HIGH (ref 0.450–4.500)

## 2019-07-10 LAB — LIPID PANEL
Chol/HDL Ratio: 3.4 ratio (ref 0.0–4.4)
Cholesterol, Total: 236 mg/dL — ABNORMAL HIGH (ref 100–199)
HDL: 69 mg/dL (ref >39)
LDL Calculated: 142 mg/dL — ABNORMAL HIGH (ref 0–99)
Triglycerides: 125 mg/dL (ref 0–149)
VLDL Cholesterol Cal: 25 mg/dL (ref 5–40)

## 2019-09-11 ENCOUNTER — Other Ambulatory Visit: Payer: Self-pay | Admitting: Internal Medicine

## 2019-12-21 ENCOUNTER — Other Ambulatory Visit: Payer: Self-pay | Admitting: Internal Medicine

## 2020-03-04 ENCOUNTER — Other Ambulatory Visit: Payer: Self-pay | Admitting: Internal Medicine

## 2020-04-27 ENCOUNTER — Ambulatory Visit (INDEPENDENT_AMBULATORY_CARE_PROVIDER_SITE_OTHER): Payer: Medicare PPO

## 2020-04-27 DIAGNOSIS — Z0000 Encounter for general adult medical examination without abnormal findings: Secondary | ICD-10-CM | POA: Diagnosis not present

## 2020-04-27 NOTE — Progress Notes (Signed)
Subjective:   Shelly Fischer is a 69 y.o. female who presents for an Initial Medicare Annual Wellness Visit.  Virtual Visit via Telephone Note  I connected with  Shelly Fischer on 04/27/20 at  2:00 PM EDT by telephone and verified that I am speaking with the correct person using two identifiers.  Medicare Annual Wellness visit completed telephonically due to Covid-19 pandemic.   Location: Patient: home Provider: office   I discussed the limitations, risks, security and privacy concerns of performing an evaluation and management service by telephone and the availability of in person appointments. The patient expressed understanding and agreed to proceed.  Unable to perform video visit due to patient does not have video capability.   Some vital signs may be absent or patient reported.   Shelly Marker, LPN    Review of Systems            Objective:    There were no vitals filed for this visit. There is no height or weight on file to calculate BMI.  Advanced Directives 04/27/2020 06/12/2017 04/06/2017 03/18/2016  Does Patient Have a Medical Advance Directive? No No No;Yes Yes  Type of Advance Directive - - Living will [place]  Would patient like information on creating a medical advance directive? Yes (MAU/Ambulatory/Procedural Areas - Information given) No - Patient declined - -    Current Medications (verified) Outpatient Encounter Medications as of 04/27/2020  Medication Sig  . cyanocobalamin (,VITAMIN B-12,) 1000 MCG/ML injection INJECT 1 ML (1,000 MCG TOTAL) INTO THE MUSCLE EVERY 30 (THIRTY) DAYS.  Marland Kitchen diphenhydrAMINE (BENADRYL) 25 MG tablet Take 25 mg by mouth every 6 (six) hours as needed.  . Flaxseed, Linseed, (FLAX SEED OIL) 1000 MG CAPS Take 750 mg by mouth.   . hydrochlorothiazide (HYDRODIURIL) 25 MG tablet Take 1 tablet (25 mg total) by mouth daily.  Marland Kitchen ibuprofen (ADVIL,MOTRIN) 200 MG tablet Take 200 mg by mouth 2 (two) times daily.  Marland Kitchen  ketoconazole (NIZORAL) 2 % shampoo APPLY 1 APPLICATION TOPICALLY 2 (TWO) TIMES A WEEK.  Marland Kitchen losartan (COZAAR) 100 MG tablet Take 1 tablet (100 mg total) by mouth daily.  . [DISCONTINUED] cetirizine (ZYRTEC) 10 MG tablet Take 1 tablet by mouth daily.   No facility-administered encounter medications on file as of 04/27/2020.    Allergies (verified) Calcium channel blockers and Loratadine   History: Past Medical History:  Diagnosis Date  . Hypertension   . Vitamin B12 deficiency    Past Surgical History:  Procedure Laterality Date  . ABDOMINAL HYSTERECTOMY  1990   endometriosis  . COLONOSCOPY WITH PROPOFOL N/A 06/12/2017   Procedure: COLONOSCOPY WITH PROPOFOL;  Surgeon: Lucilla Lame, MD;  Location: [facility];  Service: Endoscopy;  Laterality: N/A;  . FLEXIBLE SIGMOIDOSCOPY     Family History  Problem Relation Age of Onset  . CAD Mother 22  . CAD Brother 21   Social History   Socioeconomic History  . Marital status: Married    Spouse name: Not on file  . Number of children: 2  . Years of education: Not on file  . Highest education level: Not on file  Occupational History  . Not on file  Tobacco Use  . Smoking status: Never Smoker  . Smokeless tobacco: Never Used  Substance and Sexual Activity  . Alcohol use: No    Alcohol/week: 0.0 standard drinks  . Drug use: No  . Sexual activity: Not on file  Other Topics Concern  . Not on  file  Social History Narrative  . Not on file   Social Determinants of Health   Financial Resource Strain: Low Risk   . Difficulty of Paying Living Expenses: Not hard at all  Food Insecurity: No Food Insecurity  . Worried About Charity fundraiser in the Last Year: Never true  . Ran Out of Food in the Last Year: Never true  Transportation Needs: No Transportation Needs  . Lack of Transportation (Medical): No  . Lack of Transportation (Non-Medical): No  Physical Activity: Inactive  . Days of Exercise per Week: 0 days  . Minutes of  Exercise per Session: 0 min  Stress: No Stress Concern Present  . Feeling of Stress : Not at all  Social Connections: Unknown  . Frequency of Communication with Friends and Family: Patient refused  . Frequency of Social Gatherings with Friends and Family: Patient refused  . Attends Religious Services: Patient refused  . Active Member of Clubs or Organizations: Patient refused  . Attends Archivist Meetings: Patient refused  . Marital Status: Married    Tobacco Counseling Counseling given: Not Answered   Clinical Intake:  Pre-visit preparation completed: Yes  Pain : No/denies pain     Nutritional Risks: None Diabetes: No  How often do you need to have someone help you when you read instructions, pamphlets, or other written materials from your doctor or pharmacy?: 1 - Never  Interpreter Needed?: No  Information entered by :: Shelly Marker LPN   Activities of Daily Living In your present state of health, do you have any difficulty performing the following activities: 07/09/2019  Hearing? N  Vision? N  Difficulty concentrating or making decisions? N  Walking or climbing stairs? N  Dressing or bathing? N  Doing errands, shopping? N  Some recent data might be hidden     Immunizations and Health Maintenance Immunization History  Administered Date(s) Administered  . Influenza, High Dose Seasonal PF 10/19/2017  . Influenza-Unspecified 10/16/2019  . Moderna SARS-COVID-2 Vaccination 02/24/2020, 03/23/2020  . Pneumococcal Conjugate-13 03/18/2016  . Pneumococcal Polysaccharide-23 04/06/2017  . Tdap 06/26/2013   There are no preventive care reminders to display for this patient.  Patient Care Team: Shelly Hess, MD as PCP - General (Internal Medicine)  Indicate any recent Medical Services you may have received from other than [facility] providers in the past year (date may be approximate).     Assessment:   This is a routine wellness examination for  [name].  Hearing/Vision screen  Hearing Screening   125Hz  250Hz  500Hz  1000Hz  2000Hz  3000Hz  4000Hz  6000Hz  8000Hz   Right ear:           Left ear:           Comments: Pt denies hearing difficulty  Vision Screening Comments: Past due for eye exam. Plans to establish care with new provider.   Dietary issues and exercise activities discussed:    Goals   None    Depression Screen PHQ 2/9 Scores 04/27/2020 07/09/2019 04/19/2018 04/06/2017 03/18/2016  PHQ - 2 Score 0 0 0 0 0  PHQ- 9 Score - 0 - - -    Fall Risk Fall Risk  07/09/2019 04/19/2018 04/06/2017 03/18/2016  Falls in the past year? 1 No No No  Number falls in past yr: 1 - - -  Injury with Fall? 1 - - -  Comment broke arm- 05/2018 - - -  Risk for fall due to : History of fall(s);Impaired balance/gait - - -  Follow  up Falls evaluation completed - - -    FALL RISK PREVENTION PERTAINING TO THE HOME:  Any stairs in or around the home? Yes  If so, do they handrails? Yes   Home free of loose throw rugs in walkways, pet beds, electrical cords, etc? Yes  Adequate lighting in your home to reduce risk of falls? Yes   ASSISTIVE DEVICES UTILIZED TO PREVENT FALLS:  Life alert? No  Use of a cane, walker or w/c? No  Grab bars in the bathroom? No  Shower chair or bench in shower? No  Elevated toilet seat or a handicapped toilet? No   DME ORDERS:  DME order needed?  No   TIMED UP AND GO:  Was the test performed? No . Telephonic visit.   Education: Fall risk prevention has been discussed.  Intervention(s) required? No   Cognitive Function:     6CIT Screen 04/19/2018 04/06/2017  What Year? 0 points 0 points  What month? 0 points 0 points  What time? 0 points 0 points  Count back from 20 0 points 2 points  Months in reverse 0 points 0 points  Repeat phrase 2 points 0 points  Total Score 2 2    Screening Tests Health Maintenance  Topic Date Due  . Hepatitis C Screening  03/18/2021 (Originally November 06, 1951)  . MAMMOGRAM   04/27/2021 (Originally 02/21/1999)  . INFLUENZA VACCINE  07/26/2020  . TETANUS/TDAP  06/27/2023  . COLONOSCOPY  06/13/2027  . COVID-19 Vaccine  Completed  . PNA vac Low Risk Adult  Completed  . DEXA SCAN  Addressed    Qualifies for Shingles Vaccine? Yes  . Due for Shingrix. Education has been provided regarding the importance of this vaccine. Pt has been advised to call insurance company to determine out of pocket expense. Advised may also receive vaccine at local pharmacy or Health Dept. Verbalized acceptance and understanding.  Tdap: Up to date  Flu Vaccine: Up to date  Pneumococcal Vaccine: Up to date  Covid-19 Vaccine: Up to date   Cancer Screenings:  Colorectal Screening: Completed 06/12/17. Repeat every 10 years;   Mammogram: Completed 2012. Pt declines repeat screening at this time.   Bone Density: Completed 2012. Results reflect unavailable. Pt declines repeat screening at this time.   Lung Cancer Screening: (Low Dose CT Chest recommended if Age 40-80 years, 30 pack-year currently smoking OR have quit w/in 15years.) does not qualify.   Additional Screening:  Hepatitis C Screening: does qualify; postponed  Vision Screening: Recommended annual ophthalmology exams for early detection of glaucoma and other disorders of the eye. Is the patient up to date with their annual eye exam?  No  Who is the provider or what is the name of the office in which the pt attends annual eye exams? Not established If pt is not established with a provider, would they like to be referred to a provider to establish care? No .   Dental Screening: Recommended annual dental exams for proper oral hygiene  Community Resource Referral:  CRR required this visit?  No      Plan:    I have personally reviewed and addressed the Medicare Annual Wellness questionnaire and have noted the following in the patient's chart:  A. Medical and social history B. Use of alcohol, tobacco or illicit drugs   C. Current medications and supplements D. Functional ability and status E.  Nutritional status F.  Physical activity G. Advance directives H. List of other physicians I.  Hospitalizations, surgeries, and ER  visits in previous 12 months J.  [place] such as hearing and vision if needed, cognitive and depression L. Referrals and appointments   In addition, I have reviewed and discussed with patient certain preventive protocols, quality metrics, and best practice recommendations. A written personalized care plan for preventive services as well as general preventive health recommendations were provided to patient.   Signed,  Shelly Marker, LPN Nurse Health Advisor   Nurse Notes: none

## 2020-04-27 NOTE — Patient Instructions (Signed)
Shelly Fischer , Thank you for taking time to come for your Medicare Wellness Visit. I appreciate your ongoing commitment to your health goals. Please review the following plan we discussed and let me know if I can assist you in the future.   Screening recommendations/referrals: Colonoscopy: done 06/12/17. Repeat in 2028 Mammogram: postponed Bone Density: postponed Recommended yearly ophthalmology/optometry visit for glaucoma screening and checkup Recommended yearly dental visit for hygiene and checkup  Vaccinations: Influenza vaccine: done 10/16/19 Pneumococcal vaccine: done 04/06/17 Tdap vaccine: done 06/26/13 Shingles vaccine: Shingrix discussed. Please contact your pharmacy for coverage information.  Covid-19: done 02/24/20 & 03/23/20  Advanced directives: Advance directive discussed with you today. I have provided a copy for you to complete at home and have notarized. Once this is complete please bring a copy in to our office so we can scan it into your chart.  Conditions/risks identified: Recommend increasing physical activity   Next appointment: Please follow up in one year for your Medicare Annual Wellness visit.     Preventive Care 71 Years and Older, Female Preventive care refers to lifestyle choices and visits with your health care provider that can promote health and wellness. What does preventive care include?  A yearly physical exam. This is also called an annual well check.  Dental exams once or twice a year.  Routine eye exams. Ask your health care provider how often you should have your eyes checked.  Personal lifestyle choices, including:  Daily care of your teeth and gums.  Regular physical activity.  Eating a healthy diet.  Avoiding tobacco and drug use.  Limiting alcohol use.  Practicing safe sex.  Taking low-dose aspirin every day.  Taking vitamin and mineral supplements as recommended by your health care provider. What happens during an annual well  check? The services and screenings done by your health care provider during your annual well check will depend on your age, overall health, lifestyle risk factors, and family history of disease. Counseling  Your health care provider may ask you questions about your:  Alcohol use.  Tobacco use.  Drug use.  Emotional well-being.  Home and relationship well-being.  Sexual activity.  Eating habits.  History of falls.  Memory and ability to understand (cognition).  Work and work Statistician.  Reproductive health. Screening  You may have the following tests or measurements:  Height, weight, and BMI.  Blood pressure.  Lipid and cholesterol levels. These may be checked every 5 years, or more frequently if you are over 20 years old.  Skin check.  Lung cancer screening. You may have this screening every year starting at age 63 if you have a 30-pack-year history of smoking and currently smoke or have quit within the past 15 years.  Fecal occult blood test (FOBT) of the stool. You may have this test every year starting at age 54.  Flexible sigmoidoscopy or colonoscopy. You may have a sigmoidoscopy every 5 years or a colonoscopy every 10 years starting at age 55.  Hepatitis C blood test.  Hepatitis B blood test.  Sexually transmitted disease (STD) testing.  Diabetes screening. This is done by checking your blood sugar (glucose) after you have not eaten for a while (fasting). You may have this done every 1-3 years.  Bone density scan. This is done to screen for osteoporosis. You may have this done starting at age 1.  Mammogram. This may be done every 1-2 years. Talk to your health care provider about how often you should have regular mammograms.  Talk with your health care provider about your test results, treatment options, and if necessary, the need for more tests. Vaccines  Your health care provider may recommend certain vaccines, such as:  Influenza vaccine. This is  recommended every year.  Tetanus, diphtheria, and acellular pertussis (Tdap, Td) vaccine. You may need a Td booster every 10 years.  Zoster vaccine. You may need this after age 44.  Pneumococcal 13-valent conjugate (PCV13) vaccine. One dose is recommended after age 60.  Pneumococcal polysaccharide (PPSV23) vaccine. One dose is recommended after age 4. Talk to your health care provider about which screenings and vaccines you need and how often you need them. This information is not intended to replace advice given to you by your health care provider. Make sure you discuss any questions you have with your health care provider. Document Released: 01/08/2016 Document Revised: 08/31/2016 Document Reviewed: 10/13/2015 Elsevier Interactive Patient Education  2017 [employer] Prevention in the Home Falls can cause injuries. They can happen to people of all ages. There are many things you can do to make your home safe and to help prevent falls. What can I do on the outside of my home?  Regularly fix the edges of walkways and driveways and fix any cracks.  Remove anything that might make you trip as you walk through a door, such as a raised step or threshold.  Trim any bushes or trees on the path to your home.  Use bright outdoor lighting.  Clear any walking paths of anything that might make someone trip, such as rocks or tools.  Regularly check to see if handrails are loose or broken. Make sure that both sides of any steps have handrails.  Any raised decks and porches should have guardrails on the edges.  Have any leaves, snow, or ice cleared regularly.  Use sand or salt on walking paths during winter.  Clean up any spills in your garage right away. This includes oil or grease spills. What can I do in the bathroom?  Use night lights.  Install grab bars by the toilet and in the tub and shower. Do not use towel bars as grab bars.  Use non-skid mats or decals in the tub or  shower.  If you need to sit down in the shower, use a plastic, non-slip stool.  Keep the floor dry. Clean up any water that spills on the floor as soon as it happens.  Remove soap buildup in the tub or shower regularly.  Attach bath mats securely with double-sided non-slip rug tape.  Do not have throw rugs and other things on the floor that can make you trip. What can I do in the bedroom?  Use night lights.  Make sure that you have a light by your bed that is easy to reach.  Do not use any sheets or blankets that are too big for your bed. They should not hang down onto the floor.  Have a firm chair that has side arms. You can use this for support while you get dressed.  Do not have throw rugs and other things on the floor that can make you trip. What can I do in the kitchen?  Clean up any spills right away.  Avoid walking on wet floors.  Keep items that you use a lot in easy-to-reach places.  If you need to reach something above you, use a strong step stool that has a grab bar.  Keep electrical cords out of the way.  Do not use floor polish or wax that makes floors slippery. If you must use wax, use non-skid floor wax.  Do not have throw rugs and other things on the floor that can make you trip. What can I do with my stairs?  Do not leave any items on the stairs.  Make sure that there are handrails on both sides of the stairs and use them. Fix handrails that are broken or loose. Make sure that handrails are as long as the stairways.  Check any carpeting to make sure that it is firmly attached to the stairs. Fix any carpet that is loose or worn.  Avoid having throw rugs at the top or bottom of the stairs. If you do have throw rugs, attach them to the floor with carpet tape.  Make sure that you have a light switch at the top of the stairs and the bottom of the stairs. If you do not have them, ask someone to add them for you. What else can I do to help prevent  falls?  Wear shoes that:  Do not have high heels.  Have rubber bottoms.  Are comfortable and fit you well.  Are closed at the toe. Do not wear sandals.  If you use a stepladder:  Make sure that it is fully opened. Do not climb a closed stepladder.  Make sure that both sides of the stepladder are locked into place.  Ask someone to hold it for you, if possible.  Clearly mark and make sure that you can see:  Any grab bars or handrails.  First and last steps.  Where the edge of each step is.  Use tools that help you move around (mobility aids) if they are needed. These include:  Canes.  Walkers.  Scooters.  Crutches.  Turn on the lights when you go into a dark area. Replace any light bulbs as soon as they burn out.  Set up your furniture so you have a clear path. Avoid moving your furniture around.  If any of your floors are uneven, fix them.  If there are any pets around you, be aware of where they are.  Review your medicines with your doctor. Some medicines can make you feel dizzy. This can increase your chance of falling. Ask your doctor what other things that you can do to help prevent falls. This information is not intended to replace advice given to you by your health care provider. Make sure you discuss any questions you have with your health care provider. Document Released: 10/08/2009 Document Revised: 05/19/2016 Document Reviewed: 01/16/2015 Elsevier Interactive Patient Education  2017 [employer].

## 2020-06-19 ENCOUNTER — Other Ambulatory Visit: Payer: Self-pay | Admitting: Internal Medicine

## 2020-07-10 ENCOUNTER — Ambulatory Visit: Payer: Medicare PPO | Admitting: Internal Medicine

## 2020-07-10 ENCOUNTER — Encounter: Payer: Self-pay | Admitting: Internal Medicine

## 2020-07-10 VITALS — BP 134/84 | HR 89 | Temp 98.3°F | Ht 63.0 in | Wt 152.0 lb

## 2020-07-10 DIAGNOSIS — I10 Essential (primary) hypertension: Secondary | ICD-10-CM | POA: Diagnosis not present

## 2020-07-10 DIAGNOSIS — Z23 Encounter for immunization: Secondary | ICD-10-CM

## 2020-07-10 DIAGNOSIS — E538 Deficiency of other specified B group vitamins: Secondary | ICD-10-CM

## 2020-07-10 DIAGNOSIS — E785 Hyperlipidemia, unspecified: Secondary | ICD-10-CM | POA: Diagnosis not present

## 2020-07-10 DIAGNOSIS — Z0000 Encounter for general adult medical examination without abnormal findings: Secondary | ICD-10-CM | POA: Diagnosis not present

## 2020-07-10 DIAGNOSIS — F5102 Adjustment insomnia: Secondary | ICD-10-CM

## 2020-07-10 LAB — POCT URINALYSIS DIPSTICK
Bilirubin, UA: NEGATIVE
Blood, UA: NEGATIVE
Glucose, UA: NEGATIVE
Ketones, UA: NEGATIVE
Leukocytes, UA: NEGATIVE
Nitrite, UA: NEGATIVE
Protein, UA: NEGATIVE
Spec Grav, UA: 1.01 (ref 1.010–1.025)
Urobilinogen, UA: 0.2 E.U./dL
pH, UA: 6.5 (ref 5.0–8.0)

## 2020-07-10 MED ORDER — HYDROCHLOROTHIAZIDE 25 MG PO TABS: 25.0000 mg | ORAL_TABLET | Freq: Every day | ORAL | 3 refills | Status: DC

## 2020-07-10 MED ORDER — ZALEPLON 10 MG PO CAPS: 10.0000 mg | ORAL_CAPSULE | Freq: Every evening | ORAL | 0 refills | Status: DC | PRN

## 2020-07-10 MED ORDER — SHINGRIX 50 MCG/0.5ML IM SUSR: 0.5000 mL | Freq: Once | INTRAMUSCULAR | 1 refills | Status: AC

## 2020-07-10 MED ORDER — LOSARTAN POTASSIUM 100 MG PO TABS: 100.0000 mg | ORAL_TABLET | Freq: Every day | ORAL | 3 refills | Status: DC

## 2020-07-10 NOTE — Progress Notes (Signed)
som   Date:  07/10/2020   Name:  KHALANI NOVOA   DOB:  04/08/1951   MRN:  [id number]   Chief Complaint: Annual Exam (Breast Exam. No pap- aged out. )  Shelly Fischer is a 69 y.o. female who presents today for her Complete Annual Exam. She feels fairly well. She reports exercising - not currently. She reports she is sleeping poorly. Breast complaints - none.  Her husband was just diagnosed with stage 4 lung cancer.  Mammogram: none DEXA: none Pap smear:discontinued Colonoscopy: 05/2017  Immunization History  Administered Date(s) Administered  . Influenza, High Dose Seasonal PF 10/19/2017  . Influenza-Unspecified 10/16/2019  . Moderna SARS-COVID-2 Vaccination 02/24/2020, 03/23/2020  . Pneumococcal Conjugate-13 03/18/2016  . Pneumococcal Polysaccharide-23 04/06/2017  . Tdap 06/26/2013    Hypertension This is a chronic problem. The problem is controlled. Pertinent negatives include no chest pain, headaches, palpitations or shortness of breath. Past treatments include angiotensin blockers and diuretics.  Insomnia Primary symptoms: sleep disturbance, difficulty falling asleep, frequent awakening.  The current episode started more than one month. The problem occurs nightly. The symptoms are aggravated by family issues. Treatments tried: over the counter benadryl and melatonin. Typical bedtime:  10-11 P.M..     Lab Results  Component Value Date   CREATININE 0.69 07/09/2019   BUN 15 07/09/2019   NA 138 07/09/2019   K 4.0 07/09/2019   CL 95 (L) 07/09/2019   CO2 27 07/09/2019   Lab Results  Component Value Date   CHOL 236 (H) 07/09/2019   HDL 69 07/09/2019   LDLCALC 142 (H) 07/09/2019   TRIG 125 07/09/2019   CHOLHDL 3.4 07/09/2019   Lab Results  Component Value Date   TSH 6.590 (H) 07/09/2019   No results found for: HGBA1C Lab Results  Component Value Date   WBC 5.1 07/09/2019   HGB 13.6 07/09/2019   HCT 40.9 07/09/2019   MCV 86 07/09/2019   PLT 266 07/09/2019    Lab Results  Component Value Date   ALT 20 07/09/2019   AST 18 07/09/2019   ALKPHOS 130 (H) 07/09/2019   BILITOT 0.4 07/09/2019     Review of Systems  Constitutional: Negative for chills, fatigue and fever.  HENT: Negative for congestion, hearing loss, tinnitus, trouble swallowing and voice change.   Eyes: Negative for visual disturbance.  Respiratory: Negative for cough, chest tightness, shortness of breath and wheezing.   Cardiovascular: Negative for chest pain, palpitations and leg swelling.  Gastrointestinal: Negative for abdominal pain, constipation, diarrhea and vomiting.  Endocrine: Negative for polydipsia and polyuria.  Genitourinary: Negative for dysuria, frequency, genital sores, vaginal bleeding and vaginal discharge.  Musculoskeletal: Negative for arthralgias, gait problem and joint swelling.  Skin: Negative for color change and rash.  Neurological: Negative for dizziness, tremors, light-headedness and headaches.  Hematological: Negative for adenopathy. Does not bruise/bleed easily.  Psychiatric/Behavioral: Positive for sleep disturbance. Negative for dysphoric mood. The patient is nervous/anxious (Husband is newly dxed with cancer.) and has insomnia.     Patient Active Problem List   Diagnosis Date Noted  . Special screening for malignant neoplasms, colon   . Humerus fracture 03/18/2016  . Carpal tunnel syndrome 11/21/2015  . Dyslipidemia 11/21/2015  . Essential (primary) hypertension 11/21/2015  . Hot flash, menopausal 11/21/2015  . B12 nutritional deficiency 11/21/2015  . Allergic rhinitis, seasonal 11/21/2015    Allergies  Allergen Reactions  . Calcium Channel Blockers Palpitations  . Loratadine Palpitations    Past Surgical History:  Procedure  Laterality Date  . ABDOMINAL HYSTERECTOMY  1990   endometriosis  . COLONOSCOPY WITH PROPOFOL N/A 06/12/2017   Procedure: COLONOSCOPY WITH PROPOFOL;  Surgeon: Lucilla Lame, MD;  Location: [facility];   Service: Endoscopy;  Laterality: N/A;  . FLEXIBLE SIGMOIDOSCOPY      Social History   Tobacco Use  . Smoking status: Never Smoker  . Smokeless tobacco: Never Used  Vaping Use  . Vaping Use: Never used  Substance Use Topics  . Alcohol use: No    Alcohol/week: 0.0 standard drinks  . Drug use: No     Medication list has been reviewed and updated.  Current Meds  Medication Sig  . cyanocobalamin (,VITAMIN B-12,) 1000 MCG/ML injection INJECT 1 ML (1,000 MCG TOTAL) INTO THE MUSCLE EVERY 30 (THIRTY) DAYS.  Marland Kitchen diphenhydrAMINE (BENADRYL) 25 MG tablet Take 25 mg by mouth every 6 (six) hours as needed.  . Flaxseed, Linseed, (FLAX SEED OIL) 1000 MG CAPS Take 750 mg by mouth.   . hydrochlorothiazide (HYDRODIURIL) 25 MG tablet Take 1 tablet (25 mg total) by mouth daily.  Marland Kitchen ibuprofen (ADVIL,MOTRIN) 200 MG tablet Take 200 mg by mouth 2 (two) times daily.  Marland Kitchen ketoconazole (NIZORAL) 2 % shampoo APPLY 1 APPLICATION TOPICALLY 2 (TWO) TIMES A WEEK.  Marland Kitchen losartan (COZAAR) 100 MG tablet Take 1 tablet (100 mg total) by mouth daily.    PHQ 2/9 Scores 07/10/2020 04/27/2020 07/09/2019 04/19/2018  PHQ - 2 Score 6 0 0 0  PHQ- 9 Score 15 - 0 -    GAD 7 : Generalized Anxiety Score 07/10/2020  Nervous, Anxious, on Edge 3  Control/stop worrying 3  Worry too much - different things 3  Trouble relaxing 3  Restless 2  Easily annoyed or irritable 3  Afraid - awful might happen 3  Total GAD 7 Score 20  Anxiety Difficulty Somewhat difficult    BP Readings from Last 3 Encounters:  07/10/20 134/84  07/09/19 134/62  04/19/18 132/82    Physical Exam Vitals and nursing note reviewed.  Constitutional:      General: She is not in acute distress.    Appearance: She is well-developed.  HENT:     Head: Normocephalic and atraumatic.     Right Ear: Tympanic membrane and ear canal normal.     Left Ear: Tympanic membrane and ear canal normal.     Nose:     Right Sinus: No maxillary sinus tenderness.     Left Sinus:  No maxillary sinus tenderness.  Eyes:     General: No scleral icterus.       Right eye: No discharge.        Left eye: No discharge.     Conjunctiva/sclera: Conjunctivae normal.  Neck:     Thyroid: No thyromegaly.     Vascular: No carotid bruit.  Cardiovascular:     Rate and Rhythm: Normal rate and regular rhythm.     Pulses: Normal pulses.     Heart sounds: Normal heart sounds.  Pulmonary:     Effort: Pulmonary effort is normal. No respiratory distress.     Breath sounds: No wheezing.  Chest:     Breasts:        Right: No mass, nipple discharge, skin change or tenderness.        Left: No mass, nipple discharge, skin change or tenderness.  Abdominal:     General: Bowel sounds are normal.     Palpations: Abdomen is soft.     Tenderness: There  is no abdominal tenderness.  Musculoskeletal:     Cervical back: Normal range of motion. No erythema.     Right lower leg: No edema.     Left lower leg: No edema.  Lymphadenopathy:     Cervical: No cervical adenopathy.  Skin:    General: Skin is warm and dry.     Findings: No rash.  Neurological:     Mental Status: She is alert and oriented to person, place, and time.     Cranial Nerves: No cranial nerve deficit.     Sensory: No sensory deficit.     Deep Tendon Reflexes: Reflexes are normal and symmetric.  Psychiatric:        Attention and Perception: Attention normal.        Mood and Affect: Mood normal.     Wt Readings from Last 3 Encounters:  07/10/20 152 lb (68.9 kg)  07/09/19 147 lb (66.7 kg)  04/19/18 150 lb (68 kg)    BP 134/84 (BP Location: Right Arm, Patient Position: Sitting, Cuff Size: Normal)   Pulse 89   Temp 98.3 F (36.8 C) (Oral)   Ht 5\' 3"  (1.6 m)   Wt 152 lb (68.9 kg)   SpO2 95%   BMI 26.93 kg/m   Assessment and Plan: 1. Annual physical exam Normal exam Continue efforts at exercise and healthy diet - POCT urinalysis dipstick  2. Essential (primary) hypertension Clinically stable exam with well  controlled BP. Tolerating medications without side effects at this time. Pt to continue current regimen and low sodium diet; benefits of regular exercise as able discussed. - CBC with Differential/Platelet - Comprehensive metabolic panel - TSH - losartan (COZAAR) 100 MG tablet; Take 1 tablet (100 mg total) by mouth daily.  Dispense: 90 tablet; Refill: 3 - hydrochlorothiazide (HYDRODIURIL) 25 MG tablet; Take 1 tablet (25 mg total) by mouth daily.  Dispense: 90 tablet; Refill: 3  3. B12 nutritional deficiency Taking B12 injections monthly - Vitamin B12  4. Dyslipidemia Check labs - Lipid panel  5. Need for shingles vaccine First dose today - Zoster Vaccine Adjuvanted [facility]) injection; Inject 0.5 mLs into the muscle once for 1 dose.  Dispense: 0.5 mL; Refill: 1  6. Adjustment insomnia Not sleeping well due to worry over her husbands health Will try sonata at bedtime PRN - zaleplon (SONATA) 10 MG capsule; Take 1 capsule (10 mg total) by mouth at bedtime as needed for sleep.  Dispense: 30 capsule; Refill: 0   Partially dictated using Editor, commissioning. Any errors are unintentional.  Halina Maidens, MD [facility]  07/10/2020

## 2020-07-11 LAB — LIPID PANEL
Chol/HDL Ratio: 3.5 ratio (ref 0.0–4.4)
Cholesterol, Total: 227 mg/dL — ABNORMAL HIGH (ref 100–199)
HDL: 64 mg/dL (ref >39)
LDL Chol Calc (NIH): 139 mg/dL — ABNORMAL HIGH (ref 0–99)
Triglycerides: 134 mg/dL (ref 0–149)
VLDL Cholesterol Cal: 24 mg/dL (ref 5–40)

## 2020-07-11 LAB — VITAMIN B12: Vitamin B-12: 1252 pg/mL — ABNORMAL HIGH (ref 232–1245)

## 2020-07-11 LAB — CBC WITH DIFFERENTIAL/PLATELET
Basophils Absolute: 0 10*3/uL (ref 0.0–0.2)
Basos: 1 %
EOS (ABSOLUTE): 0.2 10*3/uL (ref 0.0–0.4)
Eos: 4 %
Hematocrit: 41.9 % (ref 34.0–46.6)
Hemoglobin: 13.9 g/dL (ref 11.1–15.9)
Immature Grans (Abs): 0 10*3/uL (ref 0.0–0.1)
Immature Granulocytes: 0 %
Lymphocytes Absolute: 1.1 10*3/uL (ref 0.7–3.1)
Lymphs: 24 %
MCH: 28.8 pg (ref 26.6–33.0)
MCHC: 33.2 g/dL (ref 31.5–35.7)
MCV: 87 fL (ref 79–97)
Monocytes Absolute: 0.4 10*3/uL (ref 0.1–0.9)
Monocytes: 9 %
Neutrophils Absolute: 3 10*3/uL (ref 1.4–7.0)
Neutrophils: 62 %
Platelets: 275 10*3/uL (ref 150–450)
RBC: 4.83 x10E6/uL (ref 3.77–5.28)
RDW: 13.4 % (ref 11.7–15.4)
WBC: 4.8 10*3/uL (ref 3.4–10.8)

## 2020-07-11 LAB — COMPREHENSIVE METABOLIC PANEL
ALT: 19 IU/L (ref 0–32)
AST: 15 IU/L (ref 0–40)
Albumin/Globulin Ratio: 2.4 — ABNORMAL HIGH (ref 1.2–2.2)
Albumin: 4.6 g/dL (ref 3.7–4.7)
Alkaline Phosphatase: 128 IU/L — ABNORMAL HIGH (ref 48–121)
BUN/Creatinine Ratio: 16 (ref 12–28)
BUN: 12 mg/dL (ref 8–27)
Bilirubin Total: 0.6 mg/dL (ref 0.0–1.2)
CO2: 28 mmol/L (ref 20–29)
Calcium: 9.8 mg/dL (ref 8.7–10.3)
Chloride: 95 mmol/L — ABNORMAL LOW (ref 96–106)
Creatinine, Ser: 0.76 mg/dL (ref 0.57–1.00)
GFR calc Af Amer: 91 mL/min/{1.73_m2} (ref >59)
GFR calc non Af Amer: 79 mL/min/{1.73_m2} (ref >59)
Globulin, Total: 1.9 g/dL (ref 1.5–4.5)
Glucose: 96 mg/dL (ref 65–99)
Potassium: 4 mmol/L (ref 3.5–5.2)
Sodium: 138 mmol/L (ref 134–144)
Total Protein: 6.5 g/dL (ref 6.0–8.5)

## 2020-07-11 LAB — TSH: TSH: 4.41 u[IU]/mL (ref 0.450–4.500)

## 2020-07-15 ENCOUNTER — Telehealth: Payer: Self-pay | Admitting: Internal Medicine

## 2020-07-15 NOTE — Telephone Encounter (Signed)
Phone call to pt. To go over recent lab results.  See result note of 07/11/20 from Dr. Army Melia.  Pt. Verb. Understanding.    Pt. stated she would like to ask Dr. Army Melia if she can take Qunol Turmeric 1500 mg caps. with her blood pressure medication?  Stated "the bottle reads 95 % Curcuminoids.  The recommended dose is 2 caps./ day."  Advised will send question to Dr. Army Melia.  The pt. stated she asked her Pharmacist, and was instructed to check with her doctor.

## 2020-07-15 NOTE — Telephone Encounter (Signed)
Called pt let her know that she could take tumeric with her blood pressure medication. Pt verbalized understanding.  KP

## 2020-09-07 ENCOUNTER — Other Ambulatory Visit: Payer: Self-pay | Admitting: Internal Medicine

## 2020-09-07 NOTE — Telephone Encounter (Signed)
Requested Prescriptions  Pending Prescriptions Disp Refills  . ketoconazole (NIZORAL) 2 % shampoo [Pharmacy Med Name: KETOCONAZOLE 2% SHAMPOO] 120 mL 0    Sig: APPLY 1 APPLICATION TOPICALLY 2 (TWO) TIMES A WEEK.     Over the Counter:  OTC Passed - 09/07/2020  1:27 AM      Passed - Valid encounter within last 12 months    Recent Outpatient Visits          1 month ago Annual physical exam   [facility] Glean Hess, MD   1 year ago Annual physical exam   [facility] Glean Hess, MD   2 years ago Medicare annual wellness visit, subsequent   [facility] Glean Hess, MD   3 years ago Medicare annual wellness visit, subsequent   [facility] Glean Hess, MD   4 years ago Medicare annual wellness visit, subsequent   [facility] Glean Hess, MD      Future Appointments            In 4 months Army Melia Jesse Sans, MD [facility], [place]   In 10 months Army Melia Jesse Sans, MD [facility], [facility]

## 2020-10-07 ENCOUNTER — Other Ambulatory Visit: Payer: Self-pay | Admitting: Internal Medicine

## 2020-10-07 ENCOUNTER — Other Ambulatory Visit: Payer: Self-pay

## 2020-10-07 NOTE — Telephone Encounter (Signed)
Requested medication (s) are due for refill today: yes  Requested medication (s) are on the active medication list: yes  Last refill:  08/15/20  Future visit scheduled: yes  Notes to clinic:  not delegated    Requested Prescriptions  Pending Prescriptions Disp Refills   cyanocobalamin (,VITAMIN B-12,) 1000 MCG/ML injection [Pharmacy Med Name: CYANOCOBALAMIN 1,000 MCG/ML] 3 mL 1    Sig: INJECT 1 ML (1,000 MCG TOTAL) INTO THE MUSCLE EVERY 30 (THIRTY) DAYS.      Off-Protocol Failed - 10/07/2020  9:22 AM      Failed - Medication not assigned to a protocol, review manually.      Passed - Valid encounter within last 12 months    Recent Outpatient Visits           2 months ago Annual physical exam   [facility] Glean Hess, MD   1 year ago Annual physical exam   [facility] Glean Hess, MD   2 years ago Medicare annual wellness visit, subsequent   [facility] Glean Hess, MD   3 years ago Medicare annual wellness visit, subsequent   [facility] Glean Hess, MD   4 years ago Medicare annual wellness visit, subsequent   [facility] Glean Hess, MD       Future Appointments             In 3 months Army Melia Jesse Sans, MD [facility], [place]   In 9 months Army Melia Jesse Sans, MD [facility]           Off-Protocol Failed - 10/07/2020  9:22 AM      Failed - Medication not assigned to a protocol, review manually.      Passed - Valid encounter within last 12 months    Recent Outpatient Visits           2 months ago Annual physical exam   [facility] Glean Hess, MD   1 year ago Annual physical exam   [facility] Glean Hess, MD   2 years ago Medicare annual wellness visit, subsequent   [facility] Glean Hess, MD   3 years ago Medicare annual wellness visit, subsequent   [facility] Glean Hess, MD   4 years  ago Medicare annual wellness visit, subsequent   [facility] Glean [facility], [facility]       Future Appointments             In 3 months Army Melia Jesse Sans, MD [facility], [place]   In 9 months Army Melia, Jesse Sans, MD [facility], [facility]

## 2020-10-30 ENCOUNTER — Other Ambulatory Visit: Payer: Self-pay | Admitting: Internal Medicine

## 2020-11-22 ENCOUNTER — Other Ambulatory Visit: Payer: Self-pay | Admitting: Internal Medicine

## 2020-11-22 NOTE — Telephone Encounter (Signed)
Requested Prescriptions  Pending Prescriptions Disp Refills  . ketoconazole (NIZORAL) 2 % shampoo [Pharmacy Med Name: KETOCONAZOLE 2% SHAMPOO] 120 mL 0    Sig: APPLY 1 APPLICATION TOPICALLY 2 (TWO) TIMES A WEEK.     Over the Counter:  OTC Passed - 11/22/2020  1:17 AM      Passed - Valid encounter within last 12 months    Recent Outpatient Visits          4 months ago Annual physical exam   [facility] Glean Hess, MD   1 year ago Annual physical exam   [facility] Glean Hess, MD   2 years ago Medicare annual wellness visit, subsequent   [facility] Glean Hess, MD   3 years ago Medicare annual wellness visit, subsequent   [facility] Glean Hess, MD   4 years ago Medicare annual wellness visit, subsequent   [facility] Glean Hess, MD      Future Appointments            In 1 month Army Melia Jesse Sans, MD [place], [place]   In 7 months Army Melia, Jesse Sans, MD [facility], [facility]

## 2020-12-27 ENCOUNTER — Other Ambulatory Visit: Payer: Self-pay | Admitting: Internal Medicine

## 2021-01-13 ENCOUNTER — Telehealth: Payer: Self-pay | Admitting: Hospice and Palliative Medicine

## 2021-01-13 ENCOUNTER — Ambulatory Visit: Payer: Medicare PPO | Admitting: Internal Medicine

## 2021-01-13 NOTE — Telephone Encounter (Signed)
Pt left VM requesting return call from provider. Pt can be reached at [phone]

## 2021-01-13 NOTE — Telephone Encounter (Signed)
This is not a patient at [facility]. However, she was calling in reference to her husband who is followed in the clinic. I spoke with her by phone and addressed her questions.

## 2021-01-29 ENCOUNTER — Other Ambulatory Visit: Payer: Self-pay | Admitting: Internal Medicine

## 2021-04-27 ENCOUNTER — Other Ambulatory Visit: Payer: Self-pay | Admitting: Internal Medicine

## 2021-04-27 NOTE — Telephone Encounter (Signed)
Requested medication (s) are due for refill today: no  Requested medication (s) are on the active medication list:  yes  Last refill: 01/24/2021  Future visit scheduled:yes   Notes to clinic:  Medication not assigned to a protocol, review manually  Requested Prescriptions  Pending Prescriptions Disp Refills   cyanocobalamin (,VITAMIN B-12,) 1000 MCG/ML injection [Pharmacy Med Name: CYANOCOBALAMIN 1,000 MCG/ML VL] 3 mL 1    Sig: INJECT 1 ML (1,000 MCG TOTAL) INTO THE MUSCLE EVERY 30 (THIRTY) DAYS.      Off-Protocol Failed - 04/27/2021  9:50 AM      Failed - Medication not assigned to a protocol, review manually.      Passed - Valid encounter within last 12 months    Recent Outpatient Visits           9 months ago Annual physical exam   [facility] Glean Hess, MD   1 year ago Annual physical exam   [facility] Glean Hess, MD   3 years ago Medicare annual wellness visit, subsequent   [facility] Glean Hess, MD   4 years ago Medicare annual wellness visit, subsequent   [facility] Glean Hess, MD   5 years ago Medicare annual wellness visit, subsequent   [facility] Glean Hess, MD       Future Appointments             In 2 months Glean Hess, MD [facility]            Off-Protocol Failed - 04/27/2021  9:50 AM      Failed - Medication not assigned to a protocol, review manually.      Passed - Valid encounter within last 12 months    Recent Outpatient Visits           9 months ago Annual physical exam   [facility] Glean Hess, MD   1 year ago Annual physical exam   [facility] Glean Hess, MD   3 years ago Medicare annual wellness visit, subsequent   [facility] Glean Hess, MD   4 years ago Medicare annual wellness visit, subsequent   [facility] Glean Hess, MD   5 years ago Medicare annual  wellness visit, subsequent   [facility] Glean Hess, MD       Future Appointments             In 2 months Army Melia Jesse Sans, MD [facility]

## 2021-04-28 ENCOUNTER — Ambulatory Visit (INDEPENDENT_AMBULATORY_CARE_PROVIDER_SITE_OTHER): Payer: Medicare PPO

## 2021-04-28 DIAGNOSIS — Z0000 Encounter for general adult medical examination without abnormal findings: Secondary | ICD-10-CM

## 2021-04-28 NOTE — Progress Notes (Addendum)
Subjective:   Shelly Fischer is a 70 y.o. female who presents for Medicare Annual (Subsequent) preventive examination.  Virtual Visit via Telephone Note  I connected with  Shelly Fischer on 04/28/21 at  2:00 PM EDT by telephone and verified that I am speaking with the correct person using two identifiers.  Location: Patient: home Provider: [facility]  Persons participating in the virtual visit: Shelly Fischer   I discussed the limitations, risks, security and privacy concerns of performing an evaluation and management service by telephone and the availability of in person appointments. The patient expressed understanding and agreed to proceed.  Interactive audio and video telecommunications were attempted between this nurse and patient, however failed, due to patient having technical difficulties OR patient did not have access to video capability.  We continued and completed visit with audio only.  Some vital signs may be absent or patient reported.   Shelly Marker, LPN    Review of Systems     Cardiac Risk Factors include: advanced age (>51men, >90 women);hypertension     Objective:    There were no vitals filed for this visit. There is no height or weight on file to calculate BMI.  Advanced Directives 04/28/2021 04/27/2020 06/12/2017 04/06/2017 03/18/2016  Does Patient Have a Medical Advance Directive? No No No No;Yes Yes  Type of Advance Directive - - - Living will [place]  Would patient like information on creating a medical advance directive? Yes (MAU/Ambulatory/Procedural Areas - Information given) Yes (MAU/Ambulatory/Procedural Areas - Information given) No - Patient declined - -    Current Medications (verified) Outpatient Encounter Medications as of 04/28/2021  Medication Sig  . cyanocobalamin (,VITAMIN B-12,) 1000 MCG/ML injection INJECT 1 ML (1,000 MCG TOTAL) INTO THE MUSCLE EVERY 30 (THIRTY) DAYS.  Marland Kitchen diphenhydrAMINE (BENADRYL) 25 MG tablet  Take 25 mg by mouth every 6 (six) hours as needed.  . hydrochlorothiazide (HYDRODIURIL) 25 MG tablet Take 1 tablet (25 mg total) by mouth daily.  Marland Kitchen ibuprofen (ADVIL,MOTRIN) 200 MG tablet Take 200 mg by mouth 2 (two) times daily.  Marland Kitchen ketoconazole (NIZORAL) 2 % shampoo APPLY 1 APPLICATION TOPICALLY 2 (TWO) TIMES A WEEK.  Marland Kitchen losartan (COZAAR) 100 MG tablet Take 1 tablet (100 mg total) by mouth daily.  . [DISCONTINUED] Flaxseed, Linseed, (FLAX SEED OIL) 1000 MG CAPS Take 750 mg by mouth.   . [DISCONTINUED] zaleplon (SONATA) 10 MG capsule Take 1 capsule (10 mg total) by mouth at bedtime as needed for sleep.   No facility-administered encounter medications on file as of 04/28/2021.    Allergies (verified) Calcium channel blockers and Loratadine   History: Past Medical History:  Diagnosis Date  . Hypertension   . Vitamin B12 deficiency    Past Surgical History:  Procedure Laterality Date  . ABDOMINAL HYSTERECTOMY  1990   endometriosis  . COLONOSCOPY WITH PROPOFOL N/A 06/12/2017   Procedure: COLONOSCOPY WITH PROPOFOL;  Surgeon: Lucilla Lame, MD;  Location: [facility];  Service: Endoscopy;  Laterality: N/A;  . FLEXIBLE SIGMOIDOSCOPY     Family History  Problem Relation Age of Onset  . CAD Mother 61  . CAD Brother 90   Social History   Socioeconomic History  . Marital status: Married    Spouse name: Not on file  . Number of children: 2  . Years of education: Not on file  . Highest education level: Not on file  Occupational History  . Not on file  Tobacco Use  . Smoking status:  Never Smoker  . Smokeless tobacco: Never Used  Vaping Use  . Vaping Use: Never used  Substance and Sexual Activity  . Alcohol use: No    Alcohol/week: 0.0 standard drinks  . Drug use: No  . Sexual activity: Not on file  Other Topics Concern  . Not on file  Social History Narrative  . Not on file   Social Determinants of Health   Financial Resource Strain: Low Risk   . Difficulty of Paying  Living Expenses: Not hard at all  Food Insecurity: No Food Insecurity  . Worried About Charity fundraiser in the Last Year: Never true  . Ran Out of Food in the Last Year: Never true  Transportation Needs: No Transportation Needs  . Lack of Transportation (Medical): No  . Lack of Transportation (Non-Medical): No  Physical Activity: Inactive  . Days of Exercise per Week: 0 days  . Minutes of Exercise per Session: 0 min  Stress: No Stress Concern Present  . Feeling of Stress : Only a little  Social Connections: Moderately Isolated  . Frequency of Communication with Friends and Family: More than three times a week  . Frequency of Social Gatherings with Friends and Family: Once a week  . Attends Religious Services: Never  . Active Member of Clubs or Organizations: No  . Attends Archivist Meetings: Never  . Marital Status: Married    Tobacco Counseling Counseling given: Not Answered   Clinical Intake:  Pre-visit preparation completed: Yes  Pain : No/denies pain     Nutritional Risks: None Diabetes: No  How often do you need to have someone help you when you read instructions, pamphlets, or other written materials from your doctor or pharmacy?: 1 - Never    Interpreter Needed?: No  Information entered by :: Shelly Marker LPN   Activities of Daily Living In your present state of health, do you have any difficulty performing the following activities: 04/28/2021 07/10/2020  Hearing? N N  Comment declines hearing aids -  Vision? N N  Difficulty concentrating or making decisions? N N  Walking or climbing stairs? N N  Dressing or bathing? N N  Doing errands, shopping? N N  Preparing Food and eating ? N -  Using the Toilet? N -  In the past six months, have you accidently leaked urine? N -  Do you have problems with loss of bowel control? N -  Managing your Medications? N -  Managing your Finances? N -  Housekeeping or managing your Housekeeping? N -  Some  recent data might be hidden    Patient Care Team: Shelly Hess, MD as PCP - General (Internal Medicine)  Indicate any recent Medical Services you may have received from other than [facility] providers in the past year (date may be approximate).     Assessment:   This is a routine wellness examination for [place].  Hearing/Vision screen  Hearing Screening   125Hz  250Hz  500Hz  1000Hz  2000Hz  3000Hz  4000Hz  6000Hz  8000Hz   Right ear:           Left ear:           Comments: Pt denies hearing difficulty  Vision Screening Comments: Past due for eye exam; needs to establish with eye dr.   Annette Stable issues and exercise activities discussed: Current Exercise Habits: The patient does not participate in regular exercise at present, Exercise limited by: None identified  Goals Addressed  This Visit's Progress   . Patient Stated       Pt states she would like to maintain current health      Depression Screen PHQ 2/9 Scores 04/28/2021 07/10/2020 04/27/2020 07/09/2019 04/19/2018 04/06/2017 03/18/2016  PHQ - 2 Score 2 6 0 0 0 0 0  PHQ- 9 Score 4 15 - 0 - - -    Fall Risk Fall Risk  04/28/2021 07/10/2020 07/09/2019 04/19/2018 04/06/2017  Falls in the past year? 0 0 1 No No  Number falls in past yr: 0 0 1 - -  Injury with Fall? 0 0 1 - -  Comment - - broke arm- 05/2018 - -  Risk for fall due to : No Fall Risks No Fall Risks History of fall(s);Impaired balance/gait - -  Follow up Falls prevention discussed Falls evaluation completed Falls evaluation completed - -    FALL RISK PREVENTION PERTAINING TO THE HOME:  Any stairs in or around the home? Yes  If so, are there any without handrails? No  Home free of loose throw rugs in walkways, pet beds, electrical cords, etc? Yes  Adequate lighting in your home to reduce risk of falls? Yes   ASSISTIVE DEVICES UTILIZED TO PREVENT FALLS:  Life alert? No  Use of a cane, walker or w/c? No  Grab bars in the bathroom? Yes  Shower chair or bench in  shower? Yes  Elevated toilet seat or a handicapped toilet? No   TIMED UP AND GO:  Was the test performed? No . Telephonic visit.   Cognitive Function: Normal cognitive status assessed by direct observation by this Nurse Health Advisor. No abnormalities found.       6CIT Screen 04/19/2018 04/06/2017  What Year? 0 points 0 points  What month? 0 points 0 points  What time? 0 points 0 points  Count back from 20 0 points 2 points  Months in reverse 0 points 0 points  Repeat phrase 2 points 0 points  Total Score 2 2    Immunizations Immunization History  Administered Date(s) Administered  . Influenza, High Dose Seasonal PF 10/19/2017  . Influenza-Unspecified 10/16/2019  . Moderna Sars-Covid-2 Vaccination 02/24/2020, 03/23/2020  . Pneumococcal Conjugate-13 03/18/2016  . Pneumococcal Polysaccharide-23 04/06/2017  . Tdap 06/26/2013    TDAP status: Up to date  Flu Vaccine status: Declined, Education has been provided regarding the importance of this vaccine but patient still declined. Advised may receive this vaccine at local pharmacy or Health Dept. Aware to provide a copy of the vaccination record if obtained from local pharmacy or Health Dept. Verbalized acceptance and understanding.  Pneumococcal vaccine status: Up to date  Covid-19 vaccine status: Completed vaccines  Qualifies for Shingles Vaccine? Yes   Zostavax completed No   Shingrix Completed?: No.    Education has been provided regarding the importance of this vaccine. Patient has been advised to call insurance company to determine out of pocket expense if they have not yet received this vaccine. Advised may also receive vaccine at local pharmacy or Health Dept. Verbalized acceptance and understanding.  Screening Tests Health Maintenance  Topic Date Due  . Hepatitis C Screening  Never done  . COVID-19 Vaccine (3 - Booster for Moderna series) 09/23/2020  . MAMMOGRAM  04/28/2022 (Originally 02/21/1999)  . INFLUENZA  VACCINE  07/26/2021  . TETANUS/TDAP  06/27/2023  . COLONOSCOPY (Pts 45-58yrs Insurance coverage will need to be confirmed)  06/13/2027  . PNA vac Low Risk Adult  Completed  . DEXA SCAN  Addressed  . HPV VACCINES  Aged Out    Health Maintenance  Health Maintenance Due  Topic Date Due  . Hepatitis C Screening  Never done  . COVID-19 Vaccine (3 - Booster for Moderna series) 09/23/2020    Colorectal cancer screening: Type of screening: Colonoscopy. Completed 06/12/17. Repeat every 10 years  Mammogram status: Completed 03/28/11. Repeat every year. Pt declines repeat screening at this time.   Bone density screening: completed 2012; results reflect unavailable. Pt declines repeat screening at this time.   Lung Cancer Screening: (Low Dose CT Chest recommended if Age 17-80 years, 30 pack-year currently smoking OR have quit w/in 15years.) does not qualify.   Additional Screening:  Hepatitis C Screening: does qualify; postponed  Vision Screening: Recommended annual ophthalmology exams for early detection of glaucoma and other disorders of the eye. Is the patient up to date with their annual eye exam?  No  Who is the provider or what is the name of the office in which the patient attends annual eye exams? Not established If pt is not established with a provider, would they like to be referred to a provider to establish care? No .   Dental Screening: Recommended annual dental exams for proper oral hygiene  Community Resource Referral / Chronic Care Management: CRR required this visit?  No   CCM required this visit?  No      Plan:     I have personally reviewed and noted the following in the patient's chart:   . Medical and social history . Use of alcohol, tobacco or illicit drugs  . Current medications and supplements including opioid prescriptions.  . Functional ability and status . Nutritional status . Physical activity . Advanced directives . List of other  physicians . Hospitalizations, surgeries, and ER visits in previous 12 months . Vitals . Screenings to include cognitive, depression, and falls . Referrals and appointments  In addition, I have reviewed and discussed with patient certain preventive protocols, quality metrics, and best practice recommendations. A written personalized care plan for preventive services as well as general preventive health recommendations were provided to patient.     Shelly Marker, LPN   05/30/7845   Nurse Notes: none

## 2021-04-28 NOTE — Patient Instructions (Signed)
[name] , Thank you for taking time to come for your Medicare Wellness Visit. I appreciate your ongoing commitment to your health goals. Please review the following plan we discussed and let me know if I can assist you in the future.   Screening recommendations/referrals: Colonoscopy: done 06/12/17 Mammogram: due Bone Density: due Recommended yearly ophthalmology/optometry visit for glaucoma screening and checkup Recommended yearly dental visit for hygiene and checkup  Vaccinations: Influenza vaccine: declined Pneumococcal vaccine: done 04/06/17 Tdap vaccine: done 06/26/13 Shingles vaccine: Shingrix discussed. Please contact your pharmacy for coverage information.  Covid-19:done 02/24/20 & 03/23/20  Advanced directives: Advance directive discussed with you today. I have provided a copy for you to complete at home and have notarized. Once this is complete please bring a copy in to our office so we can scan it into your chart.  Conditions/risks identified: Keep up the great work!  Next appointment: Follow up in one year for your annual wellness visit    Preventive Care 65 Years and Older, Female Preventive care refers to lifestyle choices and visits with your health care provider that can promote health and wellness. What does preventive care include?  A yearly physical exam. This is also called an annual well check.  Dental exams once or twice a year.  Routine eye exams. Ask your health care provider how often you should have your eyes checked.  Personal lifestyle choices, including:  Daily care of your teeth and gums.  Regular physical activity.  Eating a healthy diet.  Avoiding tobacco and drug use.  Limiting alcohol use.  Practicing safe sex.  Taking low-dose aspirin every day.  Taking vitamin and mineral supplements as recommended by your health care provider. What happens during an annual well check? The services and screenings done by your health care provider  during your annual well check will depend on your age, overall health, lifestyle risk factors, and family history of disease. Counseling  Your health care provider may ask you questions about your:  Alcohol use.  Tobacco use.  Drug use.  Emotional well-being.  Home and relationship well-being.  Sexual activity.  Eating habits.  History of falls.  Memory and ability to understand (cognition).  Work and work Statistician.  Reproductive health. Screening  You may have the following tests or measurements:  Height, weight, and BMI.  Blood pressure.  Lipid and cholesterol levels. These may be checked every 5 years, or more frequently if you are over 12 years old.  Skin check.  Lung cancer screening. You may have this screening every year starting at age 9 if you have a 30-pack-year history of smoking and currently smoke or have quit within the past 15 years.  Fecal occult blood test (FOBT) of the stool. You may have this test every year starting at age 37.  Flexible sigmoidoscopy or colonoscopy. You may have a sigmoidoscopy every 5 years or a colonoscopy every 10 years starting at age 35.  Hepatitis C blood test.  Hepatitis B blood test.  Sexually transmitted disease (STD) testing.  Diabetes screening. This is done by checking your blood sugar (glucose) after you have not eaten for a while (fasting). You may have this done every 1-3 years.  Bone density scan. This is done to screen for osteoporosis. You may have this done starting at age 23.  Mammogram. This may be done every 1-2 years. Talk to your health care provider about how often you should have regular mammograms. Talk with your health care provider about your  test results, treatment options, and if necessary, the need for more tests. Vaccines  Your health care provider may recommend certain vaccines, such as:  Influenza vaccine. This is recommended every year.  Tetanus, diphtheria, and acellular pertussis  (Tdap, Td) vaccine. You may need a Td booster every 10 years.  Zoster vaccine. You may need this after age 59.  Pneumococcal 13-valent conjugate (PCV13) vaccine. One dose is recommended after age 2.  Pneumococcal polysaccharide (PPSV23) vaccine. One dose is recommended after age 63. Talk to your health care provider about which screenings and vaccines you need and how often you need them. This information is not intended to replace advice given to you by your health care provider. Make sure you discuss any questions you have with your health care provider. Document Released: 01/08/2016 Document Revised: 08/31/2016 Document Reviewed: 10/13/2015 Elsevier Interactive Patient Education  2017 [place] Prevention in the Home Falls can cause injuries. They can happen to people of all ages. There are many things you can do to make your home safe and to help prevent falls. What can I do on the outside of my home?  Regularly fix the edges of walkways and driveways and fix any cracks.  Remove anything that might make you trip as you walk through a door, such as a raised step or threshold.  Trim any bushes or trees on the path to your home.  Use bright outdoor lighting.  Clear any walking paths of anything that might make someone trip, such as rocks or tools.  Regularly check to see if handrails are loose or broken. Make sure that both sides of any steps have handrails.  Any raised decks and porches should have guardrails on the edges.  Have any leaves, snow, or ice cleared regularly.  Use sand or salt on walking paths during winter.  Clean up any spills in your garage right away. This includes oil or grease spills. What can I do in the bathroom?  Use night lights.  Install grab bars by the toilet and in the tub and shower. Do not use towel bars as grab bars.  Use non-skid mats or decals in the tub or shower.  If you need to sit down in the shower, use a plastic, non-slip  stool.  Keep the floor dry. Clean up any water that spills on the floor as soon as it happens.  Remove soap buildup in the tub or shower regularly.  Attach bath mats securely with double-sided non-slip rug tape.  Do not have throw rugs and other things on the floor that can make you trip. What can I do in the bedroom?  Use night lights.  Make sure that you have a light by your bed that is easy to reach.  Do not use any sheets or blankets that are too big for your bed. They should not hang down onto the floor.  Have a firm chair that has side arms. You can use this for support while you get dressed.  Do not have throw rugs and other things on the floor that can make you trip. What can I do in the kitchen?  Clean up any spills right away.  Avoid walking on wet floors.  Keep items that you use a lot in easy-to-reach places.  If you need to reach something above you, use a strong step stool that has a grab bar.  Keep electrical cords out of the way.  Do not use floor polish or wax that  makes floors slippery. If you must use wax, use non-skid floor wax.  Do not have throw rugs and other things on the floor that can make you trip. What can I do with my stairs?  Do not leave any items on the stairs.  Make sure that there are handrails on both sides of the stairs and use them. Fix handrails that are broken or loose. Make sure that handrails are as long as the stairways.  Check any carpeting to make sure that it is firmly attached to the stairs. Fix any carpet that is loose or worn.  Avoid having throw rugs at the top or bottom of the stairs. If you do have throw rugs, attach them to the floor with carpet tape.  Make sure that you have a light switch at the top of the stairs and the bottom of the stairs. If you do not have them, ask someone to add them for you. What else can I do to help prevent falls?  Wear shoes that:  Do not have high heels.  Have rubber bottoms.  Are  comfortable and fit you well.  Are closed at the toe. Do not wear sandals.  If you use a stepladder:  Make sure that it is fully opened. Do not climb a closed stepladder.  Make sure that both sides of the stepladder are locked into place.  Ask someone to hold it for you, if possible.  Clearly mark and make sure that you can see:  Any grab bars or handrails.  First and last steps.  Where the edge of each step is.  Use tools that help you move around (mobility aids) if they are needed. These include:  Canes.  Walkers.  Scooters.  Crutches.  Turn on the lights when you go into a dark area. Replace any light bulbs as soon as they burn out.  Set up your furniture so you have a clear path. Avoid moving your furniture around.  If any of your floors are uneven, fix them.  If there are any pets around you, be aware of where they are.  Review your medicines with your doctor. Some medicines can make you feel dizzy. This can increase your chance of falling. Ask your doctor what other things that you can do to help prevent falls. This information is not intended to replace advice given to you by your health care provider. Make sure you discuss any questions you have with your health care provider. Document Released: 10/08/2009 Document Revised: 05/19/2016 Document Reviewed: 01/16/2015 Elsevier Interactive Patient Education  2017 [employer].

## 2021-07-15 ENCOUNTER — Encounter: Payer: Self-pay | Admitting: Internal Medicine

## 2021-07-15 ENCOUNTER — Other Ambulatory Visit: Payer: Self-pay

## 2021-07-15 ENCOUNTER — Ambulatory Visit: Payer: Medicare PPO | Admitting: Internal Medicine

## 2021-07-15 VITALS — BP 122/68 | HR 77 | Temp 97.9°F | Ht 63.0 in | Wt 141.0 lb

## 2021-07-15 DIAGNOSIS — Z0000 Encounter for general adult medical examination without abnormal findings: Secondary | ICD-10-CM | POA: Diagnosis not present

## 2021-07-15 DIAGNOSIS — I10 Essential (primary) hypertension: Secondary | ICD-10-CM | POA: Diagnosis not present

## 2021-07-15 DIAGNOSIS — E785 Hyperlipidemia, unspecified: Secondary | ICD-10-CM | POA: Diagnosis not present

## 2021-07-15 DIAGNOSIS — Z1159 Encounter for screening for other viral diseases: Secondary | ICD-10-CM

## 2021-07-15 LAB — POCT URINALYSIS DIPSTICK
Bilirubin, UA: NEGATIVE
Blood, UA: NEGATIVE
Glucose, UA: NEGATIVE
Ketones, UA: NEGATIVE
Leukocytes, UA: NEGATIVE
Nitrite, UA: NEGATIVE
Protein, UA: NEGATIVE
Spec Grav, UA: 1.01 (ref 1.010–1.025)
Urobilinogen, UA: 0.2 E.U./dL
pH, UA: 6.5 (ref 5.0–8.0)

## 2021-07-15 MED ORDER — HYDROCHLOROTHIAZIDE 25 MG PO TABS: 25.0000 mg | ORAL_TABLET | Freq: Every day | ORAL | 3 refills | Status: DC

## 2021-07-15 MED ORDER — LOSARTAN POTASSIUM 100 MG PO TABS: 100.0000 mg | ORAL_TABLET | Freq: Every day | ORAL | 3 refills | Status: DC

## 2021-07-15 NOTE — Progress Notes (Signed)
Date:  07/15/2021   Name:  Shelly Fischer   DOB:  11/13/1951   MRN:  [id number]   Chief Complaint: Annual Exam (Breast exam no pap) OMAYA NIELAND is a 70 y.o. female who presents today for her Complete Annual Exam. She feels well. She reports exercising none. She reports she is sleeping well. Breast complaints none.  She has lost 12 lbs with IF.  Her husband is hanging in with his cancer treatment but is homebound due to severe back problems.  Mammogram: declined DEXA: 2012 Pap smear: discontinued Colonoscopy: 05/2017 normal  Immunization History  Administered Date(s) Administered   Influenza, High Dose Seasonal PF 10/19/2017   Influenza-Unspecified 10/16/2019   Moderna Sars-Covid-2 Vaccination 02/24/2020, 03/23/2020   Pneumococcal Conjugate-13 03/18/2016   Pneumococcal Polysaccharide-23 04/06/2017   Tdap 06/26/2013    Hypertension This is a chronic problem. The problem is controlled. Pertinent negatives include no chest pain, headaches, palpitations or shortness of breath. Past treatments include angiotensin blockers and diuretics. The current treatment provides significant improvement. There is no history of kidney disease, CAD/MI or CVA.   Lab Results  Component Value Date   CREATININE 0.76 07/10/2020   BUN 12 07/10/2020   NA 138 07/10/2020   K 4.0 07/10/2020   CL 95 (L) 07/10/2020   CO2 28 07/10/2020   Lab Results  Component Value Date   CHOL 227 (H) 07/10/2020   HDL 64 07/10/2020   LDLCALC 139 (H) 07/10/2020   TRIG 134 07/10/2020   CHOLHDL 3.5 07/10/2020   Lab Results  Component Value Date   TSH 4.410 07/10/2020   No results found for: HGBA1C Lab Results  Component Value Date   WBC 4.8 07/10/2020   HGB 13.9 07/10/2020   HCT 41.9 07/10/2020   MCV 87 07/10/2020   PLT 275 07/10/2020   Lab Results  Component Value Date   ALT 19 07/10/2020   AST 15 07/10/2020   ALKPHOS 128 (H) 07/10/2020   BILITOT 0.6 07/10/2020     Review of Systems   Constitutional:  Negative for chills, fatigue and fever.  HENT:  Negative for congestion, hearing loss, tinnitus, trouble swallowing and voice change.   Eyes:  Negative for visual disturbance.  Respiratory:  Negative for cough, chest tightness, shortness of breath and wheezing.   Cardiovascular:  Negative for chest pain, palpitations and leg swelling.  Gastrointestinal:  Negative for abdominal pain, constipation, diarrhea and vomiting.  Endocrine: Negative for polydipsia and polyuria.  Genitourinary:  Negative for dysuria, frequency, genital sores, vaginal bleeding and vaginal discharge.  Musculoskeletal:  Negative for arthralgias, gait problem and joint swelling.  Skin:  Negative for color change and rash.  Neurological:  Negative for dizziness, tremors, light-headedness and headaches.  Hematological:  Negative for adenopathy. Does not bruise/bleed easily.  Psychiatric/Behavioral:  Negative for dysphoric mood and sleep disturbance. The patient is not nervous/anxious.    Patient Active Problem List   Diagnosis Date Noted   Special screening for malignant neoplasms, colon    Humerus fracture 03/18/2016   Carpal tunnel syndrome 11/21/2015   Dyslipidemia 11/21/2015   Essential (primary) hypertension 11/21/2015   Hot flash, menopausal 11/21/2015   B12 nutritional deficiency 11/21/2015   Allergic rhinitis, seasonal 11/21/2015    Allergies  Allergen Reactions   Calcium Channel Blockers Palpitations   Loratadine Palpitations    Past Surgical History:  Procedure Laterality Date   ABDOMINAL HYSTERECTOMY  1990   endometriosis   COLONOSCOPY WITH PROPOFOL N/A 06/12/2017   Procedure:  COLONOSCOPY WITH PROPOFOL;  Surgeon: Lucilla Lame, MD;  Location: [place];  Service: Endoscopy;  Laterality: N/A;   FLEXIBLE SIGMOIDOSCOPY      Social History   Tobacco Use   Smoking status: Never   Smokeless tobacco: Never  Vaping Use   Vaping Use: Never used  Substance Use Topics    Alcohol use: No    Alcohol/week: 0.0 standard drinks   Drug use: No     Medication list has been reviewed and updated.  Current Meds  Medication Sig   cyanocobalamin (,VITAMIN B-12,) 1000 MCG/ML injection INJECT 1 ML (1,000 MCG TOTAL) INTO THE MUSCLE EVERY 30 (THIRTY) DAYS.   diphenhydrAMINE (BENADRYL) 25 MG tablet Take 25 mg by mouth every 6 (six) hours as needed.   ketoconazole (NIZORAL) 2 % shampoo APPLY 1 APPLICATION TOPICALLY 2 (TWO) TIMES A WEEK.   [DISCONTINUED] hydrochlorothiazide (HYDRODIURIL) 25 MG tablet Take 1 tablet (25 mg total) by mouth daily.   [DISCONTINUED] ibuprofen (ADVIL,MOTRIN) 200 MG tablet Take 200 mg by mouth 2 (two) times daily.   [DISCONTINUED] losartan (COZAAR) 100 MG tablet Take 1 tablet (100 mg total) by mouth daily.    PHQ 2/9 Scores 07/15/2021 04/28/2021 07/10/2020 04/27/2020  PHQ - 2 Score 0 2 6 0  PHQ- 9 Score 3 4 15  -    GAD 7 : Generalized Anxiety Score 07/15/2021 07/10/2020  Nervous, Anxious, on Edge 0 3  Control/stop worrying 0 3  Worry too much - different things 0 3  Trouble relaxing 0 3  Restless 0 2  Easily annoyed or irritable 0 3  Afraid - awful might happen 0 3  Total GAD 7 Score 0 20  Anxiety Difficulty - Somewhat difficult    BP Readings from Last 3 Encounters:  07/15/21 122/68  07/10/20 134/84  07/09/19 134/62    Physical Exam Vitals and nursing note reviewed.  Constitutional:      General: She is not in acute distress.    Appearance: She is well-developed.  HENT:     Head: Normocephalic and atraumatic.     Right Ear: Tympanic membrane and ear canal normal.     Left Ear: Tympanic membrane and ear canal normal.     Nose:     Right Sinus: No maxillary sinus tenderness.     Left Sinus: No maxillary sinus tenderness.  Eyes:     General: No scleral icterus.       Right eye: No discharge.        Left eye: No discharge.     Conjunctiva/sclera: Conjunctivae normal.  Neck:     Thyroid: No thyromegaly.     Vascular: No carotid  bruit.  Cardiovascular:     Rate and Rhythm: Normal rate and regular rhythm.     Pulses: Normal pulses.     Heart sounds: Normal heart sounds.  Pulmonary:     Effort: Pulmonary effort is normal. No respiratory distress.     Breath sounds: No wheezing.  Chest:  Breasts:    Right: No mass, nipple discharge, skin change or tenderness.     Left: No mass, nipple discharge, skin change or tenderness.  Abdominal:     General: Bowel sounds are normal.     Palpations: Abdomen is soft.     Tenderness: There is no abdominal tenderness.  Musculoskeletal:     Cervical back: Normal range of motion. No erythema.     Right lower leg: No edema.     Left lower leg: No edema.  Lymphadenopathy:     Cervical: No cervical adenopathy.  Skin:    General: Skin is warm and dry.     Capillary Refill: Capillary refill takes less than 2 seconds.     Findings: No rash.  Neurological:     Mental Status: She is alert and oriented to person, place, and time.     Cranial Nerves: No cranial nerve deficit.     Sensory: No sensory deficit.     Deep Tendon Reflexes: Reflexes are normal and symmetric.  Psychiatric:        Attention and Perception: Attention normal.        Mood and Affect: Mood normal.    Wt Readings from Last 3 Encounters:  07/15/21 141 lb (64 kg)  07/10/20 152 lb (68.9 kg)  07/09/19 147 lb (66.7 kg)    BP 122/68   Pulse 77   Temp 97.9 F (36.6 C) (Oral)   Ht 5\' 3"  (1.6 m)   Wt 141 lb (64 kg)   SpO2 97%   BMI 24.98 kg/m   Assessment and Plan: 1. Annual physical exam Normal exam Continue healthy diet, weight loss with IF She declines Mammogram and DEXA  2. Essential (primary) hypertension Clinically stable exam with well controlled BP. Tolerating medications without side effects at this time. Pt to continue current regimen and low sodium diet; benefits of regular exercise as able discussed. - CBC with Differential/Platelet - Comprehensive metabolic panel - TSH - POCT  urinalysis dipstick - losartan (COZAAR) 100 MG tablet; Take 1 tablet (100 mg total) by mouth daily.  Dispense: 90 tablet; Refill: 3 - hydrochlorothiazide (HYDRODIURIL) 25 MG tablet; Take 1 tablet (25 mg total) by mouth daily.  Dispense: 90 tablet; Refill: 3  3. Dyslipidemia - Lipid panel  4. Need for hepatitis C screening test - Hepatitis C antibody   Partially dictated using Editor, commissioning. Any errors are unintentional.  Halina Maidens, MD [facility]  07/15/2021

## 2021-07-16 LAB — CBC WITH DIFFERENTIAL/PLATELET
Basophils Absolute: 0.1 10*3/uL (ref 0.0–0.2)
Basos: 1 %
EOS (ABSOLUTE): 0.2 10*3/uL (ref 0.0–0.4)
Eos: 4 %
Hematocrit: 42.2 % (ref 34.0–46.6)
Hemoglobin: 14 g/dL (ref 11.1–15.9)
Immature Grans (Abs): 0 10*3/uL (ref 0.0–0.1)
Immature Granulocytes: 0 %
Lymphocytes Absolute: 1.2 10*3/uL (ref 0.7–3.1)
Lymphs: 24 %
MCH: 29 pg (ref 26.6–33.0)
MCHC: 33.2 g/dL (ref 31.5–35.7)
MCV: 88 fL (ref 79–97)
Monocytes Absolute: 0.4 10*3/uL (ref 0.1–0.9)
Monocytes: 8 %
Neutrophils Absolute: 3.1 10*3/uL (ref 1.4–7.0)
Neutrophils: 63 %
Platelets: 275 10*3/uL (ref 150–450)
RBC: 4.82 x10E6/uL (ref 3.77–5.28)
RDW: 13 % (ref 11.7–15.4)
WBC: 4.9 10*3/uL (ref 3.4–10.8)

## 2021-07-16 LAB — COMPREHENSIVE METABOLIC PANEL
ALT: 12 IU/L (ref 0–32)
AST: 11 IU/L (ref 0–40)
Albumin/Globulin Ratio: 2 (ref 1.2–2.2)
Albumin: 4.5 g/dL (ref 3.7–4.7)
Alkaline Phosphatase: 140 IU/L — ABNORMAL HIGH (ref 44–121)
BUN/Creatinine Ratio: 19 (ref 12–28)
BUN: 12 mg/dL (ref 8–27)
Bilirubin Total: 0.5 mg/dL (ref 0.0–1.2)
CO2: 28 mmol/L (ref 20–29)
Calcium: 9.5 mg/dL (ref 8.7–10.3)
Chloride: 98 mmol/L (ref 96–106)
Creatinine, Ser: 0.62 mg/dL (ref 0.57–1.00)
Globulin, Total: 2.2 g/dL (ref 1.5–4.5)
Glucose: 102 mg/dL — ABNORMAL HIGH (ref 65–99)
Potassium: 4 mmol/L (ref 3.5–5.2)
Sodium: 140 mmol/L (ref 134–144)
Total Protein: 6.7 g/dL (ref 6.0–8.5)
eGFR: 95 mL/min/{1.73_m2} (ref >59)

## 2021-07-16 LAB — LIPID PANEL
Chol/HDL Ratio: 3.2 ratio (ref 0.0–4.4)
Cholesterol, Total: 231 mg/dL — ABNORMAL HIGH (ref 100–199)
HDL: 73 mg/dL (ref >39)
LDL Chol Calc (NIH): 143 mg/dL — ABNORMAL HIGH (ref 0–99)
Triglycerides: 89 mg/dL (ref 0–149)
VLDL Cholesterol Cal: 15 mg/dL (ref 5–40)

## 2021-07-16 LAB — TSH: TSH: 4.98 u[IU]/mL — ABNORMAL HIGH (ref 0.450–4.500)

## 2021-07-16 LAB — HEPATITIS C ANTIBODY: Hep C Virus Ab: <0.1 s/co ratio (ref 0.0–0.9)

## 2021-09-07 ENCOUNTER — Other Ambulatory Visit: Payer: Self-pay | Admitting: Internal Medicine

## 2021-10-23 ENCOUNTER — Other Ambulatory Visit: Payer: Self-pay | Admitting: Internal Medicine

## 2021-10-23 NOTE — Telephone Encounter (Signed)
Requested medication (s) are due for refill today:  due 10/28/21  Requested medication (s) are on the active medication list: yes   Last refill: 04/27/21  14ml  1 refill  Future visit scheduled yes 01/20/22  Notes to clinic: off protocol  Requested Prescriptions  Pending Prescriptions Disp Refills   cyanocobalamin (,VITAMIN B-12,) 1000 MCG/ML injection [Pharmacy Med Name: CYANOCOBALAMIN 1,000 MCG/ML VL] 3 mL 1    Sig: INJECT 1 ML (1,000 MCG TOTAL) INTO THE MUSCLE EVERY 30 DAYS.     Off-Protocol Failed - 10/23/2021 12:43 AM      Failed - Medication not assigned to a protocol, review manually.      Passed - Valid encounter within last 12 months    Recent Outpatient Visits           3 months ago Annual physical exam   [facility] Glean Hess, MD   1 year ago Annual physical exam   [facility] Glean Hess, MD   2 years ago Annual physical exam   [facility] Glean Hess, MD   3 years ago Medicare annual wellness visit, subsequent   [facility] Glean Hess, MD   4 years ago Medicare annual wellness visit, subsequent   [facility] Glean Hess, MD       Future Appointments             In 2 months Army Melia Jesse Sans, MD [facility] [place]   In 9 months Glean Hess, MD [facility], [facility]           Off-Protocol Failed - 10/23/2021 12:43 AM      Failed - Medication not assigned to a protocol, review manually.      Passed - Valid encounter within last 12 months    Recent Outpatient Visits           3 months ago Annual physical exam   [facility] Glean Hess, MD   1 year ago Annual physical exam   [facility] Glean Hess, MD   2 years ago Annual physical exam   [facility] Glean Hess, MD   3 years ago Medicare annual wellness visit, subsequent   [facility] Glean Hess, MD   4 years ago Medicare annual wellness  visit, subsequent   [facility] Glean Hess, MD       Future Appointments             In 2 months Army Melia Jesse Sans, MD [facility], [place]   In 9 months Army Melia, Jesse Sans, MD [facility], [facility]

## 2021-12-16 ENCOUNTER — Other Ambulatory Visit: Payer: Self-pay | Admitting: Internal Medicine

## 2021-12-16 NOTE — Telephone Encounter (Signed)
Requested medication (s) are due for refill today: filled 10/25/21 for 3 dsoes  Requested medication (s) are on the active medication list: yes  Last refill:  10/25/21  Future visit scheduled: 01/20/22  Notes to clinic:  this med does not have a protocol for review, please assess.  Requested Prescriptions  Pending Prescriptions Disp Refills   cyanocobalamin (,VITAMIN B-12,) 1000 MCG/ML injection [Pharmacy Med Name: CYANOCOBALAMIN 1,000 MCG/ML VL] 3 mL 0    Sig: INJECT 1 ML (1,000 MCG TOTAL) INTO THE MUSCLE EVERY 30 DAYS.     Off-Protocol Failed - 12/16/2021  8:07 PM      Failed - Medication not assigned to a protocol, review manually.      Passed - Valid encounter within last 12 months    Recent Outpatient Visits           5 months ago Annual physical exam   [facility] Glean Hess, MD   1 year ago Annual physical exam   [facility] Glean Hess, MD   2 years ago Annual physical exam   [facility] Glean Hess, MD   3 years ago Medicare annual wellness visit, subsequent   [facility] Glean Hess, MD   4 years ago Medicare annual wellness visit, subsequent   [facility] Glean Hess, MD       Future Appointments             In 1 month Army Melia Jesse Sans, MD [facility], [place]   In 7 months Army Melia Jesse Sans, MD [facility], [facility]           Off-Protocol Failed - 12/16/2021  8:07 PM      Failed - Medication not assigned to a protocol, review manually.      Passed - Valid encounter within last 12 months    Recent Outpatient Visits           5 months ago Annual physical exam   [facility] Glean Hess, MD   1 year ago Annual physical exam   [facility] Glean Hess, MD   2 years ago Annual physical exam   [facility] Glean Hess, MD   3 years ago Medicare annual wellness visit, subsequent   [facility] Glean Hess, MD   4 years ago Medicare annual wellness visit, subsequent   [facility] Glean Hess, MD       Future Appointments             In 1 month Army Melia Jesse Sans, MD [facility], [place]   In 7 months Army Melia, Jesse Sans, MD [facility], [facility]

## 2022-01-18 ENCOUNTER — Other Ambulatory Visit: Payer: Self-pay | Admitting: Internal Medicine

## 2022-01-18 NOTE — Telephone Encounter (Signed)
Requested Prescriptions  Pending Prescriptions Disp Refills   ketoconazole (NIZORAL) 2 % shampoo [Pharmacy Med Name: KETOCONAZOLE 2% SHAMPOO] 120 mL 1    Sig: APPLY 1 APPLICATION TOPICALLY 2 (TWO) TIMES A WEEK.     Over the Counter:  OTC Passed - 01/18/2022  1:29 AM      Passed - Valid encounter within last 12 months    Recent Outpatient Visits          6 months ago Annual physical exam   [facility] Glean Hess, MD   1 year ago Annual physical exam   [facility] Glean Hess, MD   2 years ago Annual physical exam   [facility] Glean Hess, MD   3 years ago Medicare annual wellness visit, subsequent   [facility] Glean Hess, MD   4 years ago Medicare annual wellness visit, subsequent   [facility] Glean Hess, MD      Future Appointments            In 2 days Glean Hess, MD [facility], [place]   In 6 months Army Melia Jesse Sans, MD [facility], [facility]

## 2022-01-20 ENCOUNTER — Ambulatory Visit: Payer: Medicare PPO | Admitting: Internal Medicine

## 2022-01-20 ENCOUNTER — Encounter: Payer: Self-pay | Admitting: Internal Medicine

## 2022-01-20 ENCOUNTER — Other Ambulatory Visit: Payer: Self-pay

## 2022-01-20 VITALS — BP 138/78 | HR 81 | Ht 63.0 in | Wt 144.0 lb

## 2022-01-20 DIAGNOSIS — Z1231 Encounter for screening mammogram for malignant neoplasm of breast: Secondary | ICD-10-CM

## 2022-01-20 DIAGNOSIS — R21 Rash and other nonspecific skin eruption: Secondary | ICD-10-CM

## 2022-01-20 DIAGNOSIS — I10 Essential (primary) hypertension: Secondary | ICD-10-CM | POA: Diagnosis not present

## 2022-01-20 DIAGNOSIS — E785 Hyperlipidemia, unspecified: Secondary | ICD-10-CM

## 2022-01-20 MED ORDER — NYSTATIN-TRIAMCINOLONE 100000-0.1 UNIT/GM-% EX CREA: 1.0000 "application " | TOPICAL_CREAM | Freq: Two times a day (BID) | CUTANEOUS | 0 refills | Status: DC

## 2022-01-20 NOTE — Progress Notes (Signed)
Date:  01/20/2022   Name:  Shelly Fischer   DOB:  [birth year]   MRN:  [id number]   Chief Complaint: Hypertension and Hyperlipidemia  Hypertension This is a chronic problem. The problem is controlled. Pertinent negatives include no chest pain, headaches, palpitations or shortness of breath. Past treatments include angiotensin blockers and diuretics. The current treatment provides significant improvement. There is no history of kidney disease, CAD/MI or CVA.  Hyperlipidemia This is a chronic problem. Recent lipid tests were reviewed and are high. There are no known factors aggravating her hyperlipidemia. Pertinent negatives include no chest pain or shortness of breath. Current antihyperlipidemic treatment includes diet change.   Lab Results  Component Value Date   NA 140 07/15/2021   K 4.0 07/15/2021   CO2 28 07/15/2021   GLUCOSE 102 (H) 07/15/2021   BUN 12 07/15/2021   CREATININE 0.62 07/15/2021   CALCIUM 9.5 07/15/2021   EGFR 95 07/15/2021   GFRNONAA 79 07/10/2020   Lab Results  Component Value Date   CHOL 231 (H) 07/15/2021   HDL 73 07/15/2021   LDLCALC 143 (H) 07/15/2021   TRIG 89 07/15/2021   CHOLHDL 3.2 07/15/2021   Lab Results  Component Value Date   TSH 4.980 (H) 07/15/2021   No results found for: HGBA1C Lab Results  Component Value Date   WBC 4.9 07/15/2021   HGB 14.0 07/15/2021   HCT 42.2 07/15/2021   MCV 88 07/15/2021   PLT 275 07/15/2021   Lab Results  Component Value Date   ALT 12 07/15/2021   AST 11 07/15/2021   ALKPHOS 140 (H) 07/15/2021   BILITOT 0.5 07/15/2021   No results found for: 25OHVITD2, 25OHVITD3, VD25OH   Review of Systems  Constitutional:  Negative for appetite change, fatigue and unexpected weight change.  HENT:  Negative for nosebleeds.   Eyes:  Negative for visual disturbance.  Respiratory:  Negative for cough, chest tightness, shortness of breath and wheezing.   Cardiovascular:  Negative for chest pain, palpitations and leg  swelling.  Gastrointestinal:  Negative for abdominal pain, constipation and diarrhea.  Neurological:  Negative for dizziness, weakness, light-headedness and headaches.  Psychiatric/Behavioral:  Positive for dysphoric mood (husband passed away a month ago after an 18 mo battle with cancer). Negative for sleep disturbance. The patient is not nervous/anxious.    Patient Active Problem List   Diagnosis Date Noted   Special screening for malignant neoplasms, colon    Humerus fracture 03/18/2016   Carpal tunnel syndrome 11/21/2015   Dyslipidemia 11/21/2015   Essential (primary) hypertension 11/21/2015   Hot flash, menopausal 11/21/2015   B12 nutritional deficiency 11/21/2015   Allergic rhinitis, seasonal 11/21/2015    Allergies  Allergen Reactions   Calcium Channel Blockers Palpitations   Loratadine Palpitations    Past Surgical History:  Procedure Laterality Date   ABDOMINAL HYSTERECTOMY  1990   endometriosis   COLONOSCOPY WITH PROPOFOL N/A 06/12/2017   Procedure: COLONOSCOPY WITH PROPOFOL;  Surgeon: Lucilla Lame, MD;  Location: [place];  Service: Endoscopy;  Laterality: N/A;   FLEXIBLE SIGMOIDOSCOPY      Social History   Tobacco Use   Smoking status: Never   Smokeless tobacco: Never  Vaping Use   Vaping Use: Never used  Substance Use Topics   Alcohol use: No    Alcohol/week: 0.0 standard drinks   Drug use: No     Medication list has been reviewed and updated.  Current Meds  Medication Sig   cyanocobalamin (,VITAMIN  B-12,) 1000 MCG/ML injection INJECT 1 ML (1,000 MCG TOTAL) INTO THE MUSCLE EVERY 30 DAYS.   diphenhydrAMINE (BENADRYL) 25 MG tablet Take 25 mg by mouth every 6 (six) hours as needed.   hydrochlorothiazide (HYDRODIURIL) 25 MG tablet Take 1 tablet (25 mg total) by mouth daily.   ketoconazole (NIZORAL) 2 % shampoo APPLY 1 APPLICATION TOPICALLY 2 (TWO) TIMES A WEEK.   losartan (COZAAR) 100 MG tablet Take 1 tablet (100 mg total) by mouth daily.    nystatin-triamcinolone (MYCOLOG II) cream Apply 1 application topically 2 (two) times daily. To rash on neck and back    PHQ 2/9 Scores 01/20/2022 07/15/2021 04/28/2021 07/10/2020  PHQ - 2 Score 0 0 2 6  PHQ- 9 Score '1 3 4 15    ' GAD 7 : Generalized Anxiety Score 01/20/2022 07/15/2021 07/10/2020  Nervous, Anxious, on Edge 0 0 3  Control/stop worrying 0 0 3  Worry too much - different things 0 0 3  Trouble relaxing 0 0 3  Restless 0 0 2  Easily annoyed or irritable 0 0 3  Afraid - awful might happen 0 0 3  Total GAD 7 Score 0 0 20  Anxiety Difficulty Not difficult at all - Somewhat difficult    BP Readings from Last 3 Encounters:  01/20/22 138/78  07/15/21 122/68  07/10/20 134/84    Physical Exam Vitals and nursing note reviewed.  Constitutional:      General: She is not in acute distress.    Appearance: Normal appearance. She is well-developed.  HENT:     Head: Normocephalic and atraumatic.  Neck:     Vascular: No carotid bruit.  Cardiovascular:     Rate and Rhythm: Normal rate and regular rhythm.     Pulses: Normal pulses.  Pulmonary:     Effort: Pulmonary effort is normal. No respiratory distress.     Breath sounds: No wheezing or rhonchi.  Musculoskeletal:     Cervical back: Normal range of motion.     Right lower leg: No edema.     Left lower leg: No edema.  Lymphadenopathy:     Cervical: No cervical adenopathy.  Skin:    General: Skin is warm and dry.     Findings: Rash (scaly rough red confluent area on right neck) present.  Neurological:     Mental Status: She is alert and oriented to person, place, and time.  Psychiatric:        Mood and Affect: Mood normal.        Behavior: Behavior normal.    Wt Readings from Last 3 Encounters:  01/20/22 144 lb (65.3 kg)  07/15/21 141 lb (64 kg)  07/10/20 152 lb (68.9 kg)    BP 138/78 (BP Location: Right Arm, Cuff Size: Normal)    Pulse 81    Ht '5\' 3"'  (1.6 m)    Wt 144 lb (65.3 kg)    SpO2 98%    BMI 25.51 kg/m    Assessment and Plan: 1. Essential (primary) hypertension Clinically stable exam with well controlled BP. Tolerating medications without side effects at this time. Pt to continue current regimen and low sodium diet; benefits of regular exercise as able discussed.  2. Dyslipidemia Continue healthy diet  3. Encounter for screening mammogram for breast cancer Consider mammogram this year  4. Rash and nonspecific skin eruption - nystatin-triamcinolone (MYCOLOG II) cream; Apply 1 application topically 2 (two) times daily. To rash on neck and back  Dispense: 30 g; Refill: 0  Partially dictated using Editor, commissioning. Any errors are unintentional.  Halina Maidens, MD [facility]  01/20/2022

## 2022-04-01 ENCOUNTER — Other Ambulatory Visit: Payer: Self-pay | Admitting: Internal Medicine

## 2022-04-04 NOTE — Telephone Encounter (Signed)
Requested medications are due for refill today.  yes ? ?Requested medications are on the active medications list.  yes ? ?Last refill. 01/20/2022 173m 1 refill ? ?Future visit scheduled.   yes ? ?Notes to clinic.  Medication refill is not delegated. ? ? ? ?Requested Prescriptions  ?Pending Prescriptions Disp Refills  ? ketoconazole (NIZORAL) 2 % shampoo [Pharmacy Med Name: KETOCONAZOLE 2% SHAMPOO] 120 mL 1  ?  Sig: APPLY 1 APPLICATION TOPICALLY 2 (TWO) TIMES A WEEK.  ?  ? Not Delegated - Over the Counter: OTC 2 Failed - 04/01/2022  5:02 PM  ?  ?  Failed - This refill cannot be delegated  ?  ?  Passed - Valid encounter within last 12 months  ?  Recent Outpatient Visits   ? ?      ? 2 months ago Essential (primary) hypertension  ? [facility]  ? 8 months ago [facility]  [facility]  ? 1 year ago Annual physical exam  ? [facility]  ? 2 years ago Annual physical exam  ? [facility]  ? 3 years ago Medicare annual wellness visit, subsequent  ? [facility]  ? ?  ?  ?Future Appointments   ? ?        ? In 3 months BArmy MeliaLJesse Sans MD [facility] ? ?  ? ?  ?  ?  ?  ?

## 2022-04-12 ENCOUNTER — Other Ambulatory Visit: Payer: Self-pay | Admitting: Internal Medicine

## 2022-04-12 NOTE — Telephone Encounter (Signed)
Requested medications are due for refill today.  unsure ? ?Requested medications are on the active medications list.  yes ? ?Last refill. 12/17/2021 78m 0 refill ? ?Future visit scheduled.   yes ? ?Notes to clinic.  Refill failed protocol due to expired /abnormal labs. ? ? ? ?Requested Prescriptions  ?Pending Prescriptions Disp Refills  ? cyanocobalamin (,VITAMIN B-12,) 1000 MCG/ML injection [Pharmacy Med Name: CYANOCOBALAMIN 1,000 MCG/ML VL] 3 mL 0  ?  Sig: INJECT 1 ML (1,000 MCG) INTRAMUSCULARLY EVERY 30 DAYS  ?  ? Endocrinology:  Vitamins - Vitamin B12 Failed - 04/12/2022  8:35 AM  ?  ?  Failed - B12 Level in normal range and within 360 days  ?  Vitamin B-12  ?Date Value Ref Range Status  ?07/10/2020 1,252 (H) 232 - 1,245 pg/mL Final  ?  ?  ?  ?  Passed - HCT in normal range and within 360 days  ?  Hematocrit  ?Date Value Ref Range Status  ?07/15/2021 42.2 34.0 - 46.6 % Final  ?  ?  ?  ?  Passed - HGB in normal range and within 360 days  ?  Hemoglobin  ?Date Value Ref Range Status  ?07/15/2021 14.0 11.1 - 15.9 g/dL Final  ?  ?  ?  ?  Passed - Valid encounter within last 12 months  ?  Recent Outpatient Visits   ? ?      ? 2 months ago Essential (primary) hypertension  ? [facility]  ? 9 months ago Annual physical exam  ? [facility]  ? 1 year ago Annual physical exam  ? [facility]  ? 2 years ago Annual physical exam  ? [facility]  ? 3 years ago Medicare annual wellness visit, subsequent  ? [facility]  ? ?  ?  ?Future Appointments   ? ?        ? In 3 months BArmy MeliaLJesse Sans MD [facility] ? ?  ? ? ?  ?  ?  ?  ?

## 2022-05-02 ENCOUNTER — Ambulatory Visit (INDEPENDENT_AMBULATORY_CARE_PROVIDER_SITE_OTHER): Payer: Medicare PPO

## 2022-05-02 DIAGNOSIS — Z0000 Encounter for general adult medical examination without abnormal findings: Secondary | ICD-10-CM | POA: Diagnosis not present

## 2022-05-02 NOTE — Patient Instructions (Signed)
Shelly Fischer , ?Thank you for taking time to come for your Medicare Wellness Visit. I appreciate your ongoing commitment to your health goals. Please review the following plan we discussed and let me know if I can assist you in the future.  ? ?Screening recommendations/referrals: ?Colonoscopy: done 06/12/17. Repeat 05/2027 ?Mammogram: declined ?Bone Density: declined ?Recommended yearly ophthalmology/optometry visit for glaucoma screening and checkup ?Recommended yearly dental visit for hygiene and checkup ? ?Vaccinations: ?Influenza vaccine: due fall 2023 ?Pneumococcal vaccine: done 04/06/17 ?Tdap vaccine: done 06/26/13 ?Shingles vaccine: Shingrix discussed. Please contact your pharmacy for coverage information.  ?Covid-19:done 02/24/20 & 03/23/20 ? ?Advanced directives: Please bring a copy of your health care power of attorney and living will to the office at your convenience once you have those documents updated.  ? ?Conditions/risks identified: Recommend increasing activity as tolerated ? ?Next appointment: Follow up in one year for your annual wellness visit  ? ? ?Preventive Care 73 Years and Older, Female ?Preventive care refers to lifestyle choices and visits with your health care provider that can promote health and wellness. ?What does preventive care include? ?A yearly physical exam. This is also called an annual well check. ?Dental exams once or twice a year. ?Routine eye exams. Ask your health care provider how often you should have your eyes checked. ?Personal lifestyle choices, including: ?Daily care of your teeth and gums. ?Regular physical activity. ?Eating a healthy diet. ?Avoiding tobacco and drug use. ?Limiting alcohol use. ?Practicing safe sex. ?Taking low-dose aspirin every day. ?Taking vitamin and mineral supplements as recommended by your health care provider. ?What happens during an annual well check? ?The services and screenings done by your health care provider during your annual well check will  depend on your age, overall health, lifestyle risk factors, and family history of disease. ?Counseling  ?Your health care provider may ask you questions about your: ?Alcohol use. ?Tobacco use. ?Drug use. ?Emotional well-being. ?Home and relationship well-being. ?Sexual activity. ?Eating habits. ?History of falls. ?Memory and ability to understand (cognition). ?Work and work Statistician. ?Reproductive health. ?Screening  ?You may have the following tests or measurements: ?Height, weight, and BMI. ?Blood pressure. ?Lipid and cholesterol levels. These may be checked every 5 years, or more frequently if you are over 57 years old. ?Skin check. ?Lung cancer screening. You may have this screening every year starting at age 73 if you have a 30-pack-year history of smoking and currently smoke or have quit within the past 15 years. ?Fecal occult blood test (FOBT) of the stool. You may have this test every year starting at age 65. ?Flexible sigmoidoscopy or colonoscopy. You may have a sigmoidoscopy every 5 years or a colonoscopy every 10 years starting at age 44. ?Hepatitis C blood test. ?Hepatitis B blood test. ?Sexually transmitted disease (STD) testing. ?Diabetes screening. This is done by checking your blood sugar (glucose) after you have not eaten for a while (fasting). You may have this done every 1-3 years. ?Bone density scan. This is done to screen for osteoporosis. You may have this done starting at age 12. ?Mammogram. This may be done every 1-2 years. Talk to your health care provider about how often you should have regular mammograms. ?Talk with your health care provider about your test results, treatment options, and if necessary, the need for more tests. ?Vaccines  ?Your health care provider may recommend certain vaccines, such as: ?Influenza vaccine. This is recommended every year. ?Tetanus, diphtheria, and acellular pertussis (Tdap, Td) vaccine. You may need a Td booster  every 10 years. ?Zoster vaccine. You may  need this after age 14. ?Pneumococcal 13-valent conjugate (PCV13) vaccine. One dose is recommended after age 61. ?Pneumococcal polysaccharide (PPSV23) vaccine. One dose is recommended after age 60. ?Talk to your health care provider about which screenings and vaccines you need and how often you need them. ?This information is not intended to replace advice given to you by your health care provider. Make sure you discuss any questions you have with your health care provider. ?Document Released: 01/08/2016 Document Revised: 08/31/2016 Document Reviewed: 10/13/2015 ?Elsevier Interactive Patient Education ? 2017 [place]. ? ?Fall Prevention in the Home ?Falls can cause injuries. They can happen to people of all ages. There are many things you can do to make your home safe and to help prevent falls. ?What can I do on the outside of my home? ?Regularly fix the edges of walkways and driveways and fix any cracks. ?Remove anything that might make you trip as you walk through a door, such as a raised step or threshold. ?Trim any bushes or trees on the path to your home. ?Use bright outdoor lighting. ?Clear any walking paths of anything that might make someone trip, such as rocks or tools. ?Regularly check to see if handrails are loose or broken. Make sure that both sides of any steps have handrails. ?Any raised decks and porches should have guardrails on the edges. ?Have any leaves, snow, or ice cleared regularly. ?Use sand or salt on walking paths during winter. ?Clean up any spills in your garage right away. This includes oil or grease spills. ?What can I do in the bathroom? ?Use night lights. ?Install grab bars by the toilet and in the tub and shower. Do not use towel bars as grab bars. ?Use non-skid mats or decals in the tub or shower. ?If you need to sit down in the shower, use a plastic, non-slip stool. ?Keep the floor dry. Clean up any water that spills on the floor as soon as it happens. ?Remove soap buildup in  the tub or shower regularly. ?Attach bath mats securely with double-sided non-slip rug tape. ?Do not have throw rugs and other things on the floor that can make you trip. ?What can I do in the bedroom? ?Use night lights. ?Make sure that you have a light by your bed that is easy to reach. ?Do not use any sheets or blankets that are too big for your bed. They should not hang down onto the floor. ?Have a firm chair that has side arms. You can use this for support while you get dressed. ?Do not have throw rugs and other things on the floor that can make you trip. ?What can I do in the kitchen? ?Clean up any spills right away. ?Avoid walking on wet floors. ?Keep items that you use a lot in easy-to-reach places. ?If you need to reach something above you, use a strong step stool that has a grab bar. ?Keep electrical cords out of the way. ?Do not use floor polish or wax that makes floors slippery. If you must use wax, use non-skid floor wax. ?Do not have throw rugs and other things on the floor that can make you trip. ?What can I do with my stairs? ?Do not leave any items on the stairs. ?Make sure that there are handrails on both sides of the stairs and use them. Fix handrails that are broken or loose. Make sure that handrails are as long as the stairways. ?Check any carpeting to  make sure that it is firmly attached to the stairs. Fix any carpet that is loose or worn. ?Avoid having throw rugs at the top or bottom of the stairs. If you do have throw rugs, attach them to the floor with carpet tape. ?Make sure that you have a light switch at the top of the stairs and the bottom of the stairs. If you do not have them, ask someone to add them for you. ?What else can I do to help prevent falls? ?Wear shoes that: ?Do not have high heels. ?Have rubber bottoms. ?Are comfortable and fit you well. ?Are closed at the toe. Do not wear sandals. ?If you use a stepladder: ?Make sure that it is fully opened. Do not climb a closed  stepladder. ?Make sure that both sides of the stepladder are locked into place. ?Ask someone to hold it for you, if possible. ?Clearly mark and make sure that you can see: ?Any grab bars or handrails. ?First and last

## 2022-05-02 NOTE — Progress Notes (Signed)
? ?Subjective:  ? Shelly Fischer is a 71 y.o. female who presents for Medicare Annual (Subsequent) preventive examination. ? ?Virtual Visit via Telephone Note ? ?I connected with  [name] on 05/02/22 at  2:00 PM EDT by telephone and verified that I am speaking with the correct person using two identifiers. ? ?Location: ?Patient: home ?Provider: [facility] ?Persons participating in the virtual visit: patient/Nurse Health Advisor ?  ?I discussed the limitations, risks, security and privacy concerns of performing an evaluation and management service by telephone and the availability of in person appointments. The patient expressed understanding and agreed to proceed. ? ?Interactive audio and video telecommunications were attempted between this nurse and patient, however failed, due to patient having technical difficulties OR patient did not have access to video capability.  We continued and completed visit with audio only. ? ?Some vital signs may be absent or patient reported.  ? ?[name], LPN ? ? ?Review of Systems    ? ?Cardiac Risk Factors include: advanced age (>23mn, >>70women);hypertension ? ?   ?Objective:  ?  ?Today's Vitals  ? 05/02/22 1408  ?PainSc: 2   ? ?There is no height or weight on file to calculate BMI. ? ? ?  05/02/2022  ?  2:12 PM 04/28/2021  ?  1:40 PM 04/27/2020  ?  2:08 PM 06/12/2017  ? 11:18 AM 04/06/2017  ?  9:36 AM 03/18/2016  ?  9:33 AM  ?Advanced Directives  ?Does Patient Have a Medical Advance Directive? Yes No No No No;Yes Yes  ?Type of AParamedicof [place] will    Living will Healthcare Power of Attorney  ?Copy of [place] Chart? No - copy requested       ?Would patient like information on creating a medical advance directive?  Yes (MAU/Ambulatory/Procedural Areas - Information given) Yes (MAU/Ambulatory/Procedural Areas - Information given) No - Patient declined    ? ? ?Current Medications (verified) ?Outpatient Encounter Medications  as of 05/02/2022  ?Medication Sig  ? cyanocobalamin (,VITAMIN B-12,) 1000 MCG/ML injection INJECT 1 ML (1,000 MCG) INTRAMUSCULARLY EVERY 30 DAYS  ? hydrochlorothiazide (HYDRODIURIL) 25 MG tablet Take 1 tablet (25 mg total) by mouth daily.  ? ketoconazole (NIZORAL) 2 % shampoo APPLY 1 APPLICATION TOPICALLY 2 (TWO) TIMES A WEEK.  ? losartan (COZAAR) 100 MG tablet Take 1 tablet (100 mg total) by mouth daily.  ? [DISCONTINUED] diphenhydrAMINE (BENADRYL) 25 MG tablet Take 25 mg by mouth every 6 (six) hours as needed.  ? [DISCONTINUED] nystatin-triamcinolone (MYCOLOG II) cream Apply 1 application topically 2 (two) times daily. To rash on neck and back  ? ?No facility-administered encounter medications on file as of 05/02/2022.  ? ? ?Allergies (verified) ?Calcium channel blockers and Loratadine  ? ?History: ?Past Medical History:  ?Diagnosis Date  ? Hypertension   ? Vitamin B12 deficiency   ? ?Past Surgical History:  ?Procedure Laterality Date  ? ABDOMINAL HYSTERECTOMY  1990  ? endometriosis  ? COLONOSCOPY WITH PROPOFOL N/A 06/12/2017  ? Procedure: COLONOSCOPY WITH PROPOFOL;  Surgeon: WLucilla Lame MD;  Location[facility]  Service: Endoscopy;  Laterality: N/A;  ? FLEXIBLE SIGMOIDOSCOPY    ? ?Family History  ?Problem Relation Age of Onset  ? CAD Mother 770 ? CAD Brother 637 ? ?Social History  ? ?Socioeconomic History  ? Marital status: Widowed  ?  Spouse name: Not on file  ? Number of children: 2  ? Years of education: Not on  file  ? Highest education level: Not on file  ?Occupational History  ? Not on file  ?Tobacco Use  ? Smoking status: Never  ? Smokeless tobacco: Never  ?Vaping Use  ? Vaping Use: Never used  ?Substance and Sexual Activity  ? Alcohol use: No  ?  Alcohol/week: 0.0 standard drinks  ? Drug use: No  ? Sexual activity: Not on file  ?Other Topics Concern  ? Not on file  ?Social History Narrative  ? Not on file  ? ?Social Determinants of Health  ? ?Financial Resource Strain: Low Risk   ? Difficulty of  Paying Living Expenses: Not hard at all  ?Food Insecurity: No Food Insecurity  ? Worried About Charity fundraiser in the Last Year: Never true  ? Ran Out of Food in the Last Year: Never true  ?Transportation Needs: No Transportation Needs  ? Lack of Transportation (Medical): No  ? Lack of Transportation (Non-Medical): No  ?Physical Activity: Inactive  ? Days of Exercise per Week: 0 days  ? Minutes of Exercise per Session: 0 min  ?Stress: No Stress Concern Present  ? Feeling of Stress : Not at all  ?Social Connections: Socially Isolated  ? Frequency of Communication with Friends and Family: More than three times a week  ? Frequency of Social Gatherings with Friends and Family: Once a week  ? Attends Religious Services: Never  ? Active Member of Clubs or Organizations: No  ? Attends Archivist Meetings: Never  ? Marital Status: Widowed  ? ? ?Tobacco Counseling ?Counseling given: Not Answered ? ? ?Clinical Intake: ? ?Pre-visit preparation completed: Yes ? ?Pain : 0-10 ?Pain Score: 2  ?Pain Type: Chronic pain ?Pain Location: Knee ?Pain Orientation: Right ?Pain Descriptors / Indicators: Aching, Sore ?Pain Onset: More than a month ago ?Pain Frequency: Intermittent ? ?  ? ?Nutritional Risks: None ?Diabetes: No ? ?How often do you need to have someone help you when you read instructions, pamphlets, or other written materials from your doctor or pharmacy?: 1 - Never ? ?Interpreter Needed?: No ? ?Information entered by :: [name] LPN ? ? ?Activities of Daily Living ? ?  05/02/2022  ?  2:13 PM 07/15/2021  ?  8:44 AM  ?In your present state of health, do you have any difficulty performing the following activities:  ?Hearing? 0 0  ?Vision? 1 0  ?Difficulty concentrating or making decisions? 0 0  ?Walking or climbing stairs? 0 0  ?Dressing or bathing? 0 0  ?Doing errands, shopping? 0 0  ?Preparing Food and eating ? N   ?Using the Toilet? N   ?In the past six months, have you accidently leaked urine? N   ?Do you have  problems with loss of bowel control? N   ?Managing your Medications? N   ?Managing your Finances? N   ?Housekeeping or managing your Housekeeping? N   ? ? ?Patient Care Team: ?Glean Hess, MD as PCP - General (Internal Medicine) ? ?Indicate any recent Medical Services you may have received from other than [facility] providers in the past year (date may be approximate). ? ?   ?Assessment:  ? This is a routine wellness examination for Chilo. ? ?Hearing/Vision screen ?Hearing Screening - Comments:: Pt denies hearing difficulty ?Vision Screening - Comments:: Past due for eye exam; needs to establish with eye dr.  ? ?Dietary issues and exercise activities discussed: ?Current Exercise Habits: The patient does not participate in regular exercise at present, Exercise limited by: orthopedic  condition(s) ? ? Goals Addressed   ?None ?  ? ?Depression Screen ? ?  05/02/2022  ?  2:11 PM 01/20/2022  ?  9:09 AM 07/15/2021  ?  8:44 AM 04/28/2021  ?  1:36 PM 07/10/2020  ?  8:36 AM 04/27/2020  ?  2:07 PM 07/09/2019  ?  8:53 AM  ?PHQ 2/9 Scores  ?PHQ - 2 Score 0 0 0 2 6 0 0  ?PHQ- 9 Score  '1 3 4 15  '$ 0  ?  ?Fall Risk ? ?  05/02/2022  ?  2:12 PM 01/20/2022  ?  9:10 AM 07/15/2021  ?  8:44 AM 04/28/2021  ?  1:53 PM 07/10/2020  ?  8:36 AM  ?Fall Risk   ?Falls in the past year? 0 0 0 0 0  ?Number falls in past yr: 0 0 0 0 0  ?Injury with Fall? 0 0 0 0 0  ?Risk for fall due to : No Fall Risks No Fall Risks No Fall Risks No Fall Risks No Fall Risks  ?Follow up Falls prevention discussed Falls evaluation completed Falls evaluation completed Falls prevention discussed Falls evaluation completed  ? ? ?FALL RISK PREVENTION PERTAINING TO THE HOME: ? ?Any stairs in or around the home? Yes  ?If so, are there any without handrails? No  ?Home free of loose throw rugs in walkways, pet beds, electrical cords, etc? Yes  ?Adequate lighting in your home to reduce risk of falls? Yes  ? ?ASSISTIVE DEVICES UTILIZED TO PREVENT FALLS: ? ?Life alert? No  ?Use of a cane, walker  or w/c? No  ?Grab bars in the bathroom? Yes  ?Shower chair or bench in shower? Yes  ?Elevated toilet seat or a handicapped toilet? No  ? ?TIMED UP AND GO: ? ?Was the test performed? No . Telephonic visit.

## 2022-07-21 ENCOUNTER — Encounter: Payer: Self-pay | Admitting: Internal Medicine

## 2022-07-21 ENCOUNTER — Ambulatory Visit (INDEPENDENT_AMBULATORY_CARE_PROVIDER_SITE_OTHER): Payer: Medicare PPO | Admitting: Internal Medicine

## 2022-07-21 VITALS — BP 124/76 | HR 87 | Ht 63.0 in | Wt 143.0 lb

## 2022-07-21 DIAGNOSIS — E538 Deficiency of other specified B group vitamins: Secondary | ICD-10-CM

## 2022-07-21 DIAGNOSIS — M7652 Patellar tendinitis, left knee: Secondary | ICD-10-CM | POA: Diagnosis not present

## 2022-07-21 DIAGNOSIS — Z0000 Encounter for general adult medical examination without abnormal findings: Secondary | ICD-10-CM

## 2022-07-21 DIAGNOSIS — Z1231 Encounter for screening mammogram for malignant neoplasm of breast: Secondary | ICD-10-CM

## 2022-07-21 DIAGNOSIS — Z23 Encounter for immunization: Secondary | ICD-10-CM

## 2022-07-21 DIAGNOSIS — M7651 Patellar tendinitis, right knee: Secondary | ICD-10-CM

## 2022-07-21 DIAGNOSIS — E785 Hyperlipidemia, unspecified: Secondary | ICD-10-CM | POA: Diagnosis not present

## 2022-07-21 DIAGNOSIS — Z13820 Encounter for screening for osteoporosis: Secondary | ICD-10-CM

## 2022-07-21 DIAGNOSIS — I10 Essential (primary) hypertension: Secondary | ICD-10-CM | POA: Diagnosis not present

## 2022-07-21 MED ORDER — LOSARTAN POTASSIUM 100 MG PO TABS: 100.0000 mg | ORAL_TABLET | Freq: Every day | ORAL | 3 refills | Status: DC

## 2022-07-21 MED ORDER — SHINGRIX 50 MCG/0.5ML IM SUSR: 0.5000 mL | Freq: Once | INTRAMUSCULAR | 1 refills | Status: AC

## 2022-07-21 MED ORDER — HYDROCHLOROTHIAZIDE 25 MG PO TABS: 25.0000 mg | ORAL_TABLET | Freq: Every day | ORAL | 3 refills | Status: DC

## 2022-07-21 NOTE — Progress Notes (Signed)
Date:  07/21/2022   Name:  Shelly Fischer   DOB:  27-Sep-1951   MRN:  [id number]   Chief Complaint: Annual Exam Shelly Fischer is a 71 y.o. female who presents today for her Complete Annual Exam. She feels well. She reports exercising some. She reports she is sleeping fairly well. Breast complaints - none.  She would like to get a mammogram and bone density.  Mammogram: none DEXA: 2012 Pap smear: discontinued Colonoscopy: 05/2017 repeat 10 yrs  Health Maintenance Due  Topic Date Due   Zoster Vaccines- Shingrix (1 of 2) Never done    Immunization History  Administered Date(s) Administered   Influenza, High Dose Seasonal PF 10/19/2017   Influenza-Unspecified 10/16/2019   Moderna Sars-Covid-2 Vaccination 02/24/2020, 03/23/2020   Pneumococcal Conjugate-13 03/18/2016   Pneumococcal Polysaccharide-23 04/06/2017   Tdap 06/26/2013    Hypertension This is a chronic problem. The problem is controlled. Pertinent negatives include no chest pain, headaches, palpitations or shortness of breath. Past treatments include angiotensin blockers and diuretics. There is no history of kidney disease, CAD/MI or CVA.  Knee Pain  There was no injury mechanism. The pain is present in the left knee and right knee. The quality of the pain is described as burning. The pain is mild. The pain has been Improving since onset. She has tried NSAIDs for the symptoms.    Lab Results  Component Value Date   NA 140 07/15/2021   K 4.0 07/15/2021   CO2 28 07/15/2021   GLUCOSE 102 (H) 07/15/2021   BUN 12 07/15/2021   CREATININE 0.62 07/15/2021   CALCIUM 9.5 07/15/2021   EGFR 95 07/15/2021   GFRNONAA 79 07/10/2020   Lab Results  Component Value Date   CHOL 231 (H) 07/15/2021   HDL 73 07/15/2021   LDLCALC 143 (H) 07/15/2021   TRIG 89 07/15/2021   CHOLHDL 3.2 07/15/2021   Lab Results  Component Value Date   TSH 4.980 (H) 07/15/2021   No results found for: "HGBA1C" Lab Results  Component Value Date    WBC 4.9 07/15/2021   HGB 14.0 07/15/2021   HCT 42.2 07/15/2021   MCV 88 07/15/2021   PLT 275 07/15/2021   Lab Results  Component Value Date   ALT 12 07/15/2021   AST 11 07/15/2021   ALKPHOS 140 (H) 07/15/2021   BILITOT 0.5 07/15/2021   No results found for: "25OHVITD2", "25OHVITD3", "VD25OH"   Review of Systems  Constitutional:  Negative for chills, fatigue and fever.  HENT:  Negative for congestion, hearing loss, tinnitus, trouble swallowing and voice change.   Eyes:  Negative for visual disturbance.  Respiratory:  Negative for cough, chest tightness, shortness of breath and wheezing.   Cardiovascular:  Negative for chest pain, palpitations and leg swelling.  Gastrointestinal:  Negative for abdominal pain, constipation, diarrhea and vomiting.  Endocrine: Negative for polydipsia and polyuria.  Genitourinary:  Negative for dysuria, frequency, genital sores, vaginal bleeding and vaginal discharge.  Musculoskeletal:  Negative for arthralgias, gait problem and joint swelling.  Skin:  Negative for color change and rash.  Neurological:  Negative for dizziness, tremors, light-headedness and headaches.  Hematological:  Negative for adenopathy. Does not bruise/bleed easily.  Psychiatric/Behavioral:  Negative for dysphoric mood and sleep disturbance. The patient is not nervous/anxious.     Patient Active Problem List   Diagnosis Date Noted   Special screening for malignant neoplasms, colon    Humerus fracture 03/18/2016   Carpal tunnel syndrome 11/21/2015   Dyslipidemia  11/21/2015   Essential (primary) hypertension 11/21/2015   Hot flash, menopausal 11/21/2015   B12 nutritional deficiency 11/21/2015   Allergic rhinitis, seasonal 11/21/2015    Allergies  Allergen Reactions   Calcium Channel Blockers Palpitations   Loratadine Palpitations    Past Surgical History:  Procedure Laterality Date   ABDOMINAL HYSTERECTOMY  1990   endometriosis   COLONOSCOPY WITH PROPOFOL N/A  06/12/2017   Procedure: COLONOSCOPY WITH PROPOFOL;  Surgeon: Lucilla Lame, MD;  Location: [facility];  Service: Endoscopy;  Laterality: N/A;   FLEXIBLE SIGMOIDOSCOPY      Social History   Tobacco Use   Smoking status: Never   Smokeless tobacco: Never  Vaping Use   Vaping Use: Never used  Substance Use Topics   Alcohol use: No    Alcohol/week: 0.0 standard drinks of alcohol   Drug use: No     Medication list has been reviewed and updated.  Current Meds  Medication Sig   cyanocobalamin (,VITAMIN B-12,) 1000 MCG/ML injection INJECT 1 ML (1,000 MCG) INTRAMUSCULARLY EVERY 30 DAYS   hydrochlorothiazide (HYDRODIURIL) 25 MG tablet Take 1 tablet (25 mg total) by mouth daily.   ketoconazole (NIZORAL) 2 % shampoo APPLY 1 APPLICATION TOPICALLY 2 (TWO) TIMES A WEEK.   losartan (COZAAR) 100 MG tablet Take 1 tablet (100 mg total) by mouth daily.       07/21/2022    9:01 AM 01/20/2022    9:10 AM 07/15/2021    8:44 AM 07/10/2020    8:38 AM  GAD 7 : Generalized Anxiety Score  Nervous, Anxious, on Edge 0 0 0 3  Control/stop worrying 0 0 0 3  Worry too much - different things 1 0 0 3  Trouble relaxing 0 0 0 3  Restless 0 0 0 2  Easily annoyed or irritable 0 0 0 3  Afraid - awful might happen 1 0 0 3  Total GAD 7 Score 2 0 0 20  Anxiety Difficulty Not difficult at all Not difficult at all  Somewhat difficult       07/21/2022    9:01 AM 05/02/2022    2:11 PM 01/20/2022    9:09 AM  Depression screen PHQ 2/9  Decreased Interest 0 0 0  Down, Depressed, Hopeless 0 0 0  PHQ - 2 Score 0 0 0  Altered sleeping 1  0  Tired, decreased energy 1  0  Change in appetite 0  1  Feeling bad or failure about yourself  0  0  Trouble concentrating 1  0  Moving slowly or fidgety/restless 0  0  Suicidal thoughts 0  0  PHQ-9 Score 3  1  Difficult doing work/chores Not difficult at all  Not difficult at all    BP Readings from Last 3 Encounters:  07/21/22 124/76  01/20/22 138/78  07/15/21  122/68    Physical Exam Vitals and nursing note reviewed.  Constitutional:      General: She is not in acute distress.    Appearance: She is well-developed.  HENT:     Head: Normocephalic and atraumatic.     Right Ear: Tympanic membrane and ear canal normal.     Left Ear: Tympanic membrane and ear canal normal.     Nose:     Right Sinus: No maxillary sinus tenderness.     Left Sinus: No maxillary sinus tenderness.  Eyes:     General: No scleral icterus.       Right eye: No discharge.  Left eye: No discharge.     Conjunctiva/sclera: Conjunctivae normal.  Neck:     Thyroid: No thyromegaly.     Vascular: No carotid bruit.  Cardiovascular:     Rate and Rhythm: Normal rate and regular rhythm.     Pulses: Normal pulses.     Heart sounds: Normal heart sounds.  Pulmonary:     Effort: Pulmonary effort is normal. No respiratory distress.     Breath sounds: No wheezing.  Chest:  Breasts:    Right: No mass, nipple discharge, skin change or tenderness.     Left: No mass, nipple discharge, skin change or tenderness.  Abdominal:     General: Bowel sounds are normal.     Palpations: Abdomen is soft.     Tenderness: There is no abdominal tenderness.  Musculoskeletal:     Cervical back: Normal range of motion. No erythema.     Right knee: No swelling, deformity, effusion or erythema. No tenderness.     Left knee: No swelling, deformity, effusion or erythema. No tenderness.     Right lower leg: No edema.     Left lower leg: No edema.  Lymphadenopathy:     Cervical: No cervical adenopathy.  Skin:    General: Skin is warm and dry.     Findings: No rash.  Neurological:     Mental Status: She is alert and oriented to person, place, and time.     Cranial Nerves: No cranial nerve deficit.     Sensory: No sensory deficit.     Deep Tendon Reflexes: Reflexes are normal and symmetric.  Psychiatric:        Attention and Perception: Attention normal.        Mood and Affect: Mood  normal.     Wt Readings from Last 3 Encounters:  07/21/22 143 lb (64.9 kg)  01/20/22 144 lb (65.3 kg)  07/15/21 141 lb (64 kg)    BP 124/76   Pulse 87   Ht '5\' 3"'  (1.6 m)   Wt 143 lb (64.9 kg)   SpO2 96%   BMI 25.33 kg/m   Assessment and Plan: 1. Annual physical exam Normal exam. Continue healthy diet, begin more regular exercise  2. Encounter for screening mammogram for breast cancer - MM 3D SCREEN BREAST BILATERAL  3. Encounter for screening for osteoporosis - DG Bone Density  4. Essential (primary) hypertension Clinically stable exam with well controlled BP. Tolerating medications without side effects at this time. Pt to continue current regimen and low sodium diet; benefits of regular exercise as able discussed. - CBC with Differential/Platelet - Comprehensive metabolic panel - TSH - Urinalysis, Routine w reflex microscopic - losartan (COZAAR) 100 MG tablet; Take 1 tablet (100 mg total) by mouth daily.  Dispense: 90 tablet; Refill: 3 - hydrochlorothiazide (HYDRODIURIL) 25 MG tablet; Take 1 tablet (25 mg total) by mouth daily.  Dispense: 90 tablet; Refill: 3  5. Dyslipidemia Check labs and advise - Lipid panel  6. Need for shingles vaccine Obtain at local pharmacy - Zoster Vaccine Adjuvanted [facility]) injection; Inject 0.5 mLs into the muscle once for 1 dose.  Dispense: 0.5 mL; Refill: 1  7. B12 nutritional deficiency Continue B12 inj every 2 weeks - self administered - Vitamin B12  8. Patellar tendonitis of both knees Continue Advil as needed Recommend also topical agents like Voltaren gel.   Partially dictated using Editor, commissioning. Any errors are unintentional.  Halina Maidens, MD [facility]  07/21/2022

## 2022-07-22 ENCOUNTER — Other Ambulatory Visit: Payer: Self-pay | Admitting: Internal Medicine

## 2022-07-22 DIAGNOSIS — R748 Abnormal levels of other serum enzymes: Secondary | ICD-10-CM

## 2022-07-22 LAB — CBC WITH DIFFERENTIAL/PLATELET
Basophils Absolute: 0.1 10*3/uL (ref 0.0–0.2)
Basos: 1 %
EOS (ABSOLUTE): 0.2 10*3/uL (ref 0.0–0.4)
Eos: 5 %
Hematocrit: 41.1 % (ref 34.0–46.6)
Hemoglobin: 13.7 g/dL (ref 11.1–15.9)
Immature Grans (Abs): 0 10*3/uL (ref 0.0–0.1)
Immature Granulocytes: 0 %
Lymphocytes Absolute: 1.1 10*3/uL (ref 0.7–3.1)
Lymphs: 25 %
MCH: 28.8 pg (ref 26.6–33.0)
MCHC: 33.3 g/dL (ref 31.5–35.7)
MCV: 87 fL (ref 79–97)
Monocytes Absolute: 0.4 10*3/uL (ref 0.1–0.9)
Monocytes: 8 %
Neutrophils Absolute: 2.8 10*3/uL (ref 1.4–7.0)
Neutrophils: 61 %
Platelets: 236 10*3/uL (ref 150–450)
RBC: 4.75 x10E6/uL (ref 3.77–5.28)
RDW: 13.1 % (ref 11.7–15.4)
WBC: 4.6 10*3/uL (ref 3.4–10.8)

## 2022-07-22 LAB — COMPREHENSIVE METABOLIC PANEL
ALT: 17 IU/L (ref 0–32)
AST: 19 IU/L (ref 0–40)
Albumin/Globulin Ratio: 2.1 (ref 1.2–2.2)
Albumin: 4.4 g/dL (ref 3.8–4.8)
Alkaline Phosphatase: 183 IU/L — ABNORMAL HIGH (ref 44–121)
BUN/Creatinine Ratio: 18 (ref 12–28)
BUN: 12 mg/dL (ref 8–27)
Bilirubin Total: 0.5 mg/dL (ref 0.0–1.2)
CO2: 25 mmol/L (ref 20–29)
Calcium: 9.3 mg/dL (ref 8.7–10.3)
Chloride: 102 mmol/L (ref 96–106)
Creatinine, Ser: 0.65 mg/dL (ref 0.57–1.00)
Globulin, Total: 2.1 g/dL (ref 1.5–4.5)
Glucose: 103 mg/dL — ABNORMAL HIGH (ref 70–99)
Potassium: 4 mmol/L (ref 3.5–5.2)
Sodium: 139 mmol/L (ref 134–144)
Total Protein: 6.5 g/dL (ref 6.0–8.5)
eGFR: 93 mL/min/{1.73_m2} (ref >59)

## 2022-07-22 LAB — URINALYSIS, ROUTINE W REFLEX MICROSCOPIC
Bilirubin, UA: NEGATIVE
Glucose, UA: NEGATIVE
Ketones, UA: NEGATIVE
Nitrite, UA: NEGATIVE
RBC, UA: NEGATIVE
Specific Gravity, UA: 1.021 (ref 1.005–1.030)
Urobilinogen, Ur: 0.2 mg/dL (ref 0.2–1.0)
pH, UA: 5.5 (ref 5.0–7.5)

## 2022-07-22 LAB — LIPID PANEL
Chol/HDL Ratio: 3.4 ratio (ref 0.0–4.4)
Cholesterol, Total: 218 mg/dL — ABNORMAL HIGH (ref 100–199)
HDL: 65 mg/dL (ref >39)
LDL Chol Calc (NIH): 129 mg/dL — ABNORMAL HIGH (ref 0–99)
Triglycerides: 139 mg/dL (ref 0–149)
VLDL Cholesterol Cal: 24 mg/dL (ref 5–40)

## 2022-07-22 LAB — MICROSCOPIC EXAMINATION
Bacteria, UA: NONE SEEN
Casts: NONE SEEN /lpf
RBC, Urine: NONE SEEN /hpf (ref 0–2)

## 2022-07-22 LAB — VITAMIN B12: Vitamin B-12: 767 pg/mL (ref 232–1245)

## 2022-07-22 LAB — TSH: TSH: 5.61 u[IU]/mL — ABNORMAL HIGH (ref 0.450–4.500)

## 2022-07-25 ENCOUNTER — Ambulatory Visit: Payer: Self-pay

## 2022-07-25 NOTE — Telephone Encounter (Signed)
Summary: Wants to discuss her lab results (anxious)   Pt called and wants to discuss her recent lab results, please advise. Advised to place clinical call per teams chat. Pt says she is worried and anxious.  Best contact: [phone]    Pt concerned about the alkaline phosphatase elevation. Pt advised she has a repeat lab ordered. When should she get the lab since the last one was 4 days ago.  Please call and advise pt when to come to lab. Reason for Disposition  NON-URGENT call redirected to PCP's office because it is open  Protocols used: No Contact or Duplicate Contact Call-A-AH

## 2022-07-27 ENCOUNTER — Telehealth: Payer: Self-pay

## 2022-07-27 NOTE — Telephone Encounter (Signed)
Called, no answer. Unable to leave VM. Sent mychart message also to inform pt to make OV for mammogram and bone density scan.

## 2022-07-27 NOTE — Telephone Encounter (Signed)
Called pt could not leave VM. Called as a reminder to call and schedule mammogram and bone density. P: [phone]  KP

## 2022-08-18 ENCOUNTER — Other Ambulatory Visit: Payer: Self-pay

## 2022-08-18 DIAGNOSIS — R748 Abnormal levels of other serum enzymes: Secondary | ICD-10-CM

## 2022-08-23 DIAGNOSIS — R748 Abnormal levels of other serum enzymes: Secondary | ICD-10-CM | POA: Diagnosis not present

## 2022-08-25 LAB — ALKALINE PHOSPHATASE, ISOENZYMES
Alkaline Phosphatase: 184 IU/L — ABNORMAL HIGH (ref 44–121)
BONE FRACTION: 59 % (ref 14–68)
INTESTINAL FRAC.: 0 % (ref 0–18)
LIVER FRACTION: 41 % (ref 18–85)

## 2022-09-07 ENCOUNTER — Ambulatory Visit
Admission: RE | Admit: 2022-09-07 | Discharge: 2022-09-07 | Disposition: A | Discharge status: Home / Self Care | Payer: Medicare PPO | Source: Ambulatory Visit | Attending: Internal Medicine | Admitting: Internal Medicine

## 2022-09-07 DIAGNOSIS — Z780 Asymptomatic menopausal state: Secondary | ICD-10-CM | POA: Diagnosis not present

## 2022-09-07 DIAGNOSIS — M8589 Other specified disorders of bone density and structure, multiple sites: Secondary | ICD-10-CM | POA: Diagnosis not present

## 2022-09-07 DIAGNOSIS — M85851 Other specified disorders of bone density and structure, right thigh: Secondary | ICD-10-CM | POA: Insufficient documentation

## 2022-09-07 DIAGNOSIS — Z1231 Encounter for screening mammogram for malignant neoplasm of breast: Secondary | ICD-10-CM | POA: Diagnosis not present

## 2022-09-07 DIAGNOSIS — Z13820 Encounter for screening for osteoporosis: Secondary | ICD-10-CM | POA: Diagnosis not present

## 2022-09-08 NOTE — Progress Notes (Signed)
Called pt could not leave VM. VM not set up.  [facility] nurse may give results to patient if they return call to clinic, a CRM has been created.  KP

## 2022-11-11 ENCOUNTER — Other Ambulatory Visit: Payer: Self-pay | Admitting: Internal Medicine

## 2022-11-11 NOTE — Telephone Encounter (Signed)
Requested Prescriptions  Pending Prescriptions Disp Refills   cyanocobalamin (VITAMIN B12) 1000 MCG/ML injection [Pharmacy Med Name: CYANOCOBALAMIN 1,000 MCG/ML VL] 3 mL 0    Sig: INJECT 1 ML (1,000 MCG) INTRAMUSCULARLY EVERY 30 DAYS     Endocrinology:  Vitamins - Vitamin B12 Passed - 11/11/2022  2:54 AM      Passed - HCT in normal range and within 360 days    Hematocrit  Date Value Ref Range Status  07/21/2022 41.1 34.0 - 46.6 % Final         Passed - HGB in normal range and within 360 days    Hemoglobin  Date Value Ref Range Status  07/21/2022 13.7 11.1 - 15.9 g/dL Final         Passed - B12 Level in normal range and within 360 days    Vitamin B-12  Date Value Ref Range Status  07/21/2022 767 232 - 1,245 pg/mL Final         Passed - Valid encounter within last 12 months    Recent Outpatient Visits           3 months ago Annual physical exam   [place] [facility] [facility], Jesse Sans, MD   9 months ago Essential (primary) hypertension   [place] [facility] at [facility], Jesse Sans, MD   1 year ago Annual physical exam   [place] [facility] at [facility], Jesse Sans, MD   2 years ago Annual physical exam   [facility] at [facility], Jesse Sans, MD   3 years ago Annual physical exam   [facility] at [facility], Jesse Sans, MD       Future Appointments             In 2 months [facility] Melia, Jesse Sans, MD [facility] at [facility], [facility]   In 9 months Army Melia, Jesse Sans, MD [place] [facility] at [facility], [facility]

## 2022-12-18 ENCOUNTER — Other Ambulatory Visit: Payer: Self-pay | Admitting: Internal Medicine

## 2023-01-23 ENCOUNTER — Encounter: Payer: Self-pay | Admitting: Internal Medicine

## 2023-01-23 ENCOUNTER — Ambulatory Visit: Payer: Medicare PPO | Admitting: Internal Medicine

## 2023-01-23 VITALS — BP 128/66 | HR 79 | Ht 63.0 in | Wt 146.0 lb

## 2023-01-23 DIAGNOSIS — I10 Essential (primary) hypertension: Secondary | ICD-10-CM

## 2023-01-23 DIAGNOSIS — D485 Neoplasm of uncertain behavior of skin: Secondary | ICD-10-CM

## 2023-01-23 DIAGNOSIS — R748 Abnormal levels of other serum enzymes: Secondary | ICD-10-CM

## 2023-01-23 DIAGNOSIS — L209 Atopic dermatitis, unspecified: Secondary | ICD-10-CM | POA: Diagnosis not present

## 2023-01-23 MED ORDER — KETOCONAZOLE 2 % EX SHAM: MEDICATED_SHAMPOO | CUTANEOUS | 3 refills | Status: DC

## 2023-01-23 NOTE — Patient Instructions (Signed)
[facility] in [place]

## 2023-01-23 NOTE — Assessment & Plan Note (Signed)
Clinically stable exam with well controlled BP on losartan and hctz. Tolerating medications without side effects. Pt to continue current regimen and low sodium diet.

## 2023-01-23 NOTE — Progress Notes (Signed)
Date:  01/23/2023   Name:  Shelly Fischer   DOB:  1951-08-08   MRN:  [id number]   Chief Complaint: Hypertension  Hypertension This is a chronic problem. The problem is controlled. Pertinent negatives include no chest pain, headaches, palpitations or shortness of breath. Past treatments include angiotensin blockers and diuretics. The current treatment provides significant improvement. There are no compliance problems.  There is no history of kidney disease, CAD/MI or CVA.  Elevated AP - last done 6 mo ago was unchanged.  Fractionation liver and bone.  No family hx of liver disease.  Personal hx of humerus fracture 2019.  Lab Results  Component Value Date   NA 139 07/21/2022   K 4.0 07/21/2022   CO2 25 07/21/2022   GLUCOSE 103 (H) 07/21/2022   BUN 12 07/21/2022   CREATININE 0.65 07/21/2022   CALCIUM 9.3 07/21/2022   EGFR 93 07/21/2022   GFRNONAA 79 07/10/2020   Lab Results  Component Value Date   CHOL 218 (H) 07/21/2022   HDL 65 07/21/2022   LDLCALC 129 (H) 07/21/2022   TRIG 139 07/21/2022   CHOLHDL 3.4 07/21/2022   Lab Results  Component Value Date   TSH 5.610 (H) 07/21/2022   No results found for: "HGBA1C" Lab Results  Component Value Date   WBC 4.6 07/21/2022   HGB 13.7 07/21/2022   HCT 41.1 07/21/2022   MCV 87 07/21/2022   PLT 236 07/21/2022   Lab Results  Component Value Date   ALT 17 07/21/2022   AST 19 07/21/2022   ALKPHOS 184 (H) 08/23/2022   BILITOT 0.5 07/21/2022   No results found for: "25OHVITD2", "25OHVITD3", "VD25OH"   Review of Systems  Constitutional:  Negative for fatigue and unexpected weight change.  HENT:  Negative for nosebleeds.   Eyes:  Negative for visual disturbance.  Respiratory:  Negative for cough, chest tightness, shortness of breath and wheezing.   Cardiovascular:  Negative for chest pain, palpitations and leg swelling.  Gastrointestinal:  Negative for abdominal pain, constipation and diarrhea.  Musculoskeletal:  Negative for  arthralgias.  Skin:  Positive for rash (keratoses on forehead and back).  Neurological:  Negative for dizziness, weakness, light-headedness and headaches.  Psychiatric/Behavioral:  Negative for dysphoric mood and sleep disturbance. The patient is not nervous/anxious.     Patient Active Problem List   Diagnosis Date Noted   Elevated alkaline phosphatase level 01/23/2023   Special screening for malignant neoplasms, colon    Humerus fracture 03/18/2016   Carpal tunnel syndrome 11/21/2015   Dyslipidemia 11/21/2015   Essential (primary) hypertension 11/21/2015   Hot flash, menopausal 11/21/2015   B12 nutritional deficiency 11/21/2015   Allergic rhinitis, seasonal 11/21/2015    Allergies  Allergen Reactions   Calcium Channel Blockers Palpitations   Loratadine Palpitations    Past Surgical History:  Procedure Laterality Date   ABDOMINAL HYSTERECTOMY  1990   endometriosis   COLONOSCOPY WITH PROPOFOL N/A 06/12/2017   Procedure: COLONOSCOPY WITH PROPOFOL;  Surgeon: Lucilla Lame, MD;  Location: [facility];  Service: Endoscopy;  Laterality: N/A;   FLEXIBLE SIGMOIDOSCOPY      Social History   Tobacco Use   Smoking status: Never   Smokeless tobacco: Never  Vaping Use   Vaping Use: Never used  Substance Use Topics   Alcohol use: No    Alcohol/week: 0.0 standard drinks of alcohol   Drug use: No     Medication list has been reviewed and updated.  Current Meds  Medication  Sig   cyanocobalamin (VITAMIN B12) 1000 MCG/ML injection INJECT 1 ML (1,000 MCG) INTRAMUSCULARLY EVERY 30 DAYS   hydrochlorothiazide (HYDRODIURIL) 25 MG tablet Take 1 tablet (25 mg total) by mouth daily.   losartan (COZAAR) 100 MG tablet Take 1 tablet (100 mg total) by mouth daily.   [DISCONTINUED] ketoconazole (NIZORAL) 2 % shampoo APPLY 1 APPLICATION TOPICALLY 2 (TWO) TIMES A WEEK.       01/23/2023    1:23 PM 07/21/2022    9:01 AM 01/20/2022    9:10 AM 07/15/2021    8:44 AM  GAD 7 : Generalized  Anxiety Score  Nervous, Anxious, on Edge 0 0 0 0  Control/stop worrying 0 0 0 0  Worry too much - different things 0 1 0 0  Trouble relaxing 0 0 0 0  Restless 0 0 0 0  Easily annoyed or irritable 0 0 0 0  Afraid - awful might happen 0 1 0 0  Total GAD 7 Score 0 2 0 0  Anxiety Difficulty Not difficult at all Not difficult at all Not difficult at all        01/23/2023    1:23 PM 07/21/2022    9:01 AM 05/02/2022    2:11 PM  Depression screen PHQ 2/9  Decreased Interest 0 0 0  Down, Depressed, Hopeless 0 0 0  PHQ - 2 Score 0 0 0  Altered sleeping 0 1   Tired, decreased energy 1 1   Change in appetite 0 0   Feeling bad or failure about yourself  0 0   Trouble concentrating 0 1   Moving slowly or fidgety/restless 0 0   Suicidal thoughts 0 0   PHQ-9 Score 1 3   Difficult doing work/chores Not difficult at all Not difficult at all     BP Readings from Last 3 Encounters:  01/23/23 128/66  07/21/22 124/76  01/20/22 138/78    Physical Exam Vitals and nursing note reviewed.  Constitutional:      General: She is not in acute distress.    Appearance: She is well-developed.  HENT:     Head: Normocephalic and atraumatic.  Cardiovascular:     Rate and Rhythm: Normal rate and regular rhythm.     Pulses: Normal pulses.  Pulmonary:     Effort: Pulmonary effort is normal. No respiratory distress.     Breath sounds: No wheezing or rhonchi.  Musculoskeletal:        General: Normal range of motion.     Cervical back: Normal range of motion.     Right lower leg: No edema.     Left lower leg: No edema.  Skin:    General: Skin is warm and dry.     Findings: No rash.     Comments: Flat papules flesh colored on forehead and mid back  Neurological:     General: No focal deficit present.     Mental Status: She is alert and oriented to person, place, and time.  Psychiatric:        Mood and Affect: Mood normal.        Behavior: Behavior normal.     Wt Readings from Last 3 Encounters:   01/23/23 146 lb (66.2 kg)  07/21/22 143 lb (64.9 kg)  01/20/22 144 lb (65.3 kg)    BP 128/66 (BP Location: Right Arm, Cuff Size: Normal)   Pulse 79   Ht '5\' 3"'$  (1.6 m)   Wt 146 lb (66.2 kg)   SpO2  97%   BMI 25.86 kg/m   Assessment and Plan: Problem List Items Addressed This Visit       Cardiovascular and Mediastinum   Essential (primary) hypertension - Primary (Chronic)    Clinically stable exam with well controlled BP on losartan and hctz. Tolerating medications without side effects. Pt to continue current regimen and low sodium diet.       Relevant Orders   Comprehensive metabolic panel     Other   Elevated alkaline phosphatase level    Labs done 6 mo ago were stable Fractionation liver and bone. Will repeat and if higher, will refer to GI      Relevant Orders   Comprehensive metabolic panel   Other Visit Diagnoses     Atopic dermatitis of scalp       Relevant Medications   ketoconazole (NIZORAL) 2 % shampoo   Neoplasm of uncertain behavior of skin       Rec evaluation with [place].        Partially dictated using Editor, commissioning. Any errors are unintentional.  Halina Maidens, MD [facility]  01/23/2023

## 2023-01-23 NOTE — Assessment & Plan Note (Signed)
Labs done 6 mo ago were stable Fractionation liver and bone. Will repeat and if higher, will refer to GI

## 2023-01-24 LAB — COMPREHENSIVE METABOLIC PANEL
ALT: 12 IU/L (ref 0–32)
AST: 17 IU/L (ref 0–40)
Albumin/Globulin Ratio: 2.1 (ref 1.2–2.2)
Albumin: 4.6 g/dL (ref 3.8–4.8)
Alkaline Phosphatase: 184 IU/L — ABNORMAL HIGH (ref 44–121)
BUN/Creatinine Ratio: 17 (ref 12–28)
BUN: 11 mg/dL (ref 8–27)
Bilirubin Total: 0.3 mg/dL (ref 0.0–1.2)
CO2: 25 mmol/L (ref 20–29)
Calcium: 9.4 mg/dL (ref 8.7–10.3)
Chloride: 100 mmol/L (ref 96–106)
Creatinine, Ser: 0.66 mg/dL (ref 0.57–1.00)
Globulin, Total: 2.2 g/dL (ref 1.5–4.5)
Glucose: 122 mg/dL — ABNORMAL HIGH (ref 70–99)
Potassium: 3.7 mmol/L (ref 3.5–5.2)
Sodium: 140 mmol/L (ref 134–144)
Total Protein: 6.8 g/dL (ref 6.0–8.5)
eGFR: 93 mL/min/{1.73_m2} (ref >59)

## 2023-02-11 ENCOUNTER — Other Ambulatory Visit: Payer: Self-pay | Admitting: Internal Medicine

## 2023-04-20 ENCOUNTER — Telehealth: Payer: Self-pay | Admitting: Internal Medicine

## 2023-04-20 NOTE — Telephone Encounter (Signed)
Contacted Shelly Fischer to schedule their annual wellness visit. Appointment made for 05/11/2023.  Verlee Rossetti; Care Guide Ambulatory Clinical Support [place] [facility] Direct Dial: [phone]

## 2023-05-11 ENCOUNTER — Ambulatory Visit (INDEPENDENT_AMBULATORY_CARE_PROVIDER_SITE_OTHER): Payer: Medicare PPO

## 2023-05-11 VITALS — Ht 63.0 in | Wt 146.0 lb

## 2023-05-11 DIAGNOSIS — Z0000 Encounter for general adult medical examination without abnormal findings: Secondary | ICD-10-CM | POA: Diagnosis not present

## 2023-05-11 NOTE — Progress Notes (Signed)
I connected with  Lamarr Lulas on 05/11/23 by a audio enabled telemedicine application and verified that I am speaking with the correct person using two identifiers.  Patient Location: Home  Provider Location: Office/Clinic  I discussed the limitations of evaluation and management by telemedicine. The patient expressed understanding and agreed to proceed.  Subjective:   NEKAYBAW KARTER is a 72 y.o. female who presents for Medicare Annual (Subsequent) preventive examination.  Review of Systems     Cardiac Risk Factors include: advanced age (>59men, >23 women);hypertension     Objective:    Today's Vitals   05/11/23 1142  Weight: 146 lb (66.2 kg)  Height: 5\' 3"  (1.6 m)   Body mass index is 25.86 kg/m.     05/11/2023   11:33 AM 05/02/2022    2:12 PM 04/28/2021    1:40 PM 04/27/2020    2:08 PM 06/12/2017   11:18 AM 04/06/2017    9:36 AM 03/18/2016    9:33 AM  Advanced Directives  Does Patient Have a Medical Advance Directive? No Yes No No No No;Yes Yes  Type of Special educational needs teacher of [place];Living will    Living will Healthcare Power of Attorney  Copy of Healthcare Power of Attorney in Chart?  No - copy requested       Would patient like information on creating a medical advance directive? No - Patient declined  Yes (MAU/Ambulatory/Procedural Areas - Information given) Yes (MAU/Ambulatory/Procedural Areas - Information given) No - Patient declined      Current Medications (verified) Outpatient Encounter Medications as of 05/11/2023  Medication Sig   cyanocobalamin (VITAMIN B12) 1000 MCG/ML injection INJECT 1 ML (1,000 MCG) INTRAMUSCULARLY EVERY 30 DAYS   hydrochlorothiazide (HYDRODIURIL) 25 MG tablet Take 1 tablet (25 mg total) by mouth daily.   ketoconazole (NIZORAL) 2 % shampoo Apply topically 2 (two) times a week.   losartan (COZAAR) 100 MG tablet Take 1 tablet (100 mg total) by mouth daily.   No facility-administered encounter medications on file as of  05/11/2023.    Allergies (verified) Calcium channel blockers and Loratadine   History: Past Medical History:  Diagnosis Date   Hypertension    Vitamin B12 deficiency    Past Surgical History:  Procedure Laterality Date   ABDOMINAL HYSTERECTOMY  1990   endometriosis   COLONOSCOPY WITH PROPOFOL N/A 06/12/2017   Procedure: COLONOSCOPY WITH PROPOFOL;  Surgeon: Midge Minium, MD;  Location: [facility];  Service: Endoscopy;  Laterality: N/A;   FLEXIBLE SIGMOIDOSCOPY     Family History  Problem Relation Age of Onset   CAD Mother 25   CAD Brother 72   Social History   Socioeconomic History   Marital status: Widowed    Spouse name: Not on file   Number of children: 2   Years of education: Not on file   Highest education level: Not on file  Occupational History   Not on file  Tobacco Use   Smoking status: Never   Smokeless tobacco: Never  Vaping Use   Vaping Use: Never used  Substance and Sexual Activity   Alcohol use: No    Alcohol/week: 0.0 standard drinks of alcohol   Drug use: No   Sexual activity: Not on file  Other Topics Concern   Not on file  Social History Narrative   Not on file   Social Determinants of Health   Financial Resource Strain: Low Risk  (05/11/2023)   Overall Financial Resource Strain (CARDIA)  Difficulty of Paying Living Expenses: Not hard at all  Food Insecurity: No Food Insecurity (05/11/2023)   Hunger Vital Sign    Worried About Running Out of Food in the Last Year: Never true    Ran Out of Food in the Last Year: Never true  Transportation Needs: No Transportation Needs (05/11/2023)   PRAPARE - Administrator, Civil Service (Medical): No    Lack of Transportation (Non-Medical): No  Physical Activity: Insufficiently Active (05/11/2023)   Exercise Vital Sign    Days of Exercise per Week: 2 days    Minutes of Exercise per Session: 30 min  Stress: No Stress Concern Present (05/11/2023)   [employer] of Occupational  Health - Occupational Stress Questionnaire    Feeling of Stress : Not at all  Social Connections: Socially Isolated (05/11/2023)   Social Connection and Isolation Panel [NHANES]    Frequency of Communication with Friends and Family: More than three times a week    Frequency of Social Gatherings with Friends and Family: Not on file    Attends Religious Services: Never    Active Member of Clubs or Organizations: No    Attends Banker Meetings: Never    Marital Status: Widowed    Tobacco Counseling Counseling given: Not Answered   Clinical Intake:  Pre-visit preparation completed: Yes  Pain : No/denies pain     Nutritional Risks: None Diabetes: No  How often do you need to have someone help you when you read instructions, pamphlets, or other written materials from your doctor or pharmacy?: 1 - Never  Diabetic?no  Interpreter Needed?: No  Information entered by :: Kennedy Bucker, LPN   Activities of Daily Living    05/11/2023   11:33 AM  In your present state of health, do you have any difficulty performing the following activities:  Hearing? 0  Vision? 0  Difficulty concentrating or making decisions? 0  Walking or climbing stairs? 0  Dressing or bathing? 0  Doing errands, shopping? 0  Preparing Food and eating ? N  Using the Toilet? N  In the past six months, have you accidently leaked urine? N  Do you have problems with loss of bowel control? N  Managing your Medications? N  Managing your Finances? N  Housekeeping or managing your Housekeeping? N    Patient Care Team: Reubin Milan, MD as PCP - General (Internal Medicine)  Indicate any recent Medical Services you may have received from other than [facility] providers in the past year (date may be approximate).     Assessment:   This is a routine wellness examination for [place].  Hearing/Vision screen Hearing Screening - Comments:: No aids Vision Screening - Comments:: Wears glasses-  Dr.Shade  Dietary issues and exercise activities discussed: Current Exercise Habits: Home exercise routine, Type of exercise: walking, Time (Minutes): 30, Frequency (Times/Week): 2, Weekly Exercise (Minutes/Week): 60, Intensity: Mild   Goals Addressed             This Visit's Progress    DIET - EAT MORE FRUITS AND VEGETABLES         Depression Screen    05/11/2023   11:30 AM 01/23/2023    1:23 PM 07/21/2022    9:01 AM 05/02/2022    2:11 PM 01/20/2022    9:09 AM 07/15/2021    8:44 AM 04/28/2021    1:36 PM  PHQ 2/9 Scores  PHQ - 2 Score 1 0 0 0 0 0 2  PHQ- 9  Score  1 3  1 3 4     Fall Risk    05/11/2023   11:33 AM 01/23/2023    1:23 PM 07/21/2022    9:01 AM 05/02/2022    2:12 PM 01/20/2022    9:10 AM  Fall Risk   Falls in the past year? 0 0 0 0 0  Number falls in past yr: 0 0 0 0 0  Injury with Fall? 0 0 0 0 0  Risk for fall due to : No Fall Risks No Fall Risks No Fall Risks No Fall Risks No Fall Risks  Follow up Falls prevention discussed;Falls evaluation completed Falls evaluation completed Falls evaluation completed Falls prevention discussed Falls evaluation completed    FALL RISK PREVENTION PERTAINING TO THE HOME:  Any stairs in or around the home? Yes  If so, are there any without handrails? No  Home free of loose throw rugs in walkways, pet beds, electrical cords, etc? Yes  Adequate lighting in your home to reduce risk of falls? Yes   ASSISTIVE DEVICES UTILIZED TO PREVENT FALLS:  Life alert? No  Use of a cane, walker or w/c? No  Grab bars in the bathroom? No  Shower chair or bench in shower? No  Elevated toilet seat or a handicapped toilet? Yes    Cognitive Function:        05/11/2023   11:39 AM 04/19/2018    9:25 AM 04/06/2017    9:38 AM  6CIT Screen  What Year? 0 points 0 points 0 points  What month? 0 points 0 points 0 points  What time? 0 points 0 points 0 points  Count back from 20 0 points 0 points 2 points  Months in reverse 0 points 0 points 0  points  Repeat phrase 0 points 2 points 0 points  Total Score 0 points 2 points 2 points    Immunizations Immunization History  Administered Date(s) Administered   Influenza, High Dose Seasonal PF 10/19/2017   Influenza-Unspecified 10/16/2019   Moderna Sars-Covid-2 Vaccination 02/24/2020, 03/23/2020   Pneumococcal Conjugate-13 03/18/2016   Pneumococcal Polysaccharide-23 04/06/2017   Tdap 06/26/2013    TDAP status: Up to date  Flu Vaccine status: Declined, Education has been provided regarding the importance of this vaccine but patient still declined. Advised may receive this vaccine at local pharmacy or Health Dept. Aware to provide a copy of the vaccination record if obtained from local pharmacy or Health Dept. Verbalized acceptance and understanding.  Pneumococcal vaccine status: Up to date  Covid-19 vaccine status: Completed vaccines  Qualifies for Shingles Vaccine? Yes   Zostavax completed No   Shingrix Completed?: No.    Education has been provided regarding the importance of this vaccine. Patient has been advised to call insurance company to determine out of pocket expense if they have not yet received this vaccine. Advised may also receive vaccine at local pharmacy or Health Dept. Verbalized acceptance and understanding.  Screening Tests Health Maintenance  Topic Date Due   Zoster Vaccines- Shingrix (1 of 2) Never done   COVID-19 Vaccine (3 - Moderna risk series) 04/20/2020   DTaP/Tdap/Td (2 - Td or Tdap) 06/27/2023   INFLUENZA VACCINE  07/27/2023   Medicare Annual Wellness (AWV)  05/10/2024   MAMMOGRAM  09/07/2024   COLONOSCOPY (Pts 45-57yrs Insurance coverage will need to be confirmed)  06/13/2027   Pneumonia Vaccine 61+ Years old  Completed   DEXA SCAN  Completed   Hepatitis C Screening  Completed   HPV VACCINES  Aged Out    Health Maintenance  Health Maintenance Due  Topic Date Due   Zoster Vaccines- Shingrix (1 of 2) Never done   COVID-19 Vaccine (3 -  Moderna risk series) 04/20/2020    Colorectal cancer screening: No longer required.   Mammogram status: Completed 09/07/22. Repeat every year  Bone Density status: Completed 09/07/22. Results reflect: Bone density results: OSTEOPENIA. Repeat every 5 years.  Lung Cancer Screening: (Low Dose CT Chest recommended if Age 65-80 years, 30 pack-year currently smoking OR have quit w/in 15years.) does not qualify.   Additional Screening:  Hepatitis C Screening: does qualify; Completed 07/15/21  Vision Screening: Recommended annual ophthalmology exams for early detection of glaucoma and other disorders of the eye. Is the patient up to date with their annual eye exam?  Yes  Who is the provider or what is the name of the office in which the patient attends annual eye exams? Dr.Shade If pt is not established with a provider, would they like to be referred to a provider to establish care? No .   Dental Screening: Recommended annual dental exams for proper oral hygiene  Community Resource Referral / Chronic Care Management: CRR required this visit?  No   CCM required this visit?  No      Plan:     I have personally reviewed and noted the following in the patient's chart:   Medical and social history Use of alcohol, tobacco or illicit drugs  Current medications and supplements including opioid prescriptions. Patient is not currently taking opioid prescriptions. Functional ability and status Nutritional status Physical activity Advanced directives List of other physicians Hospitalizations, surgeries, and ER visits in previous 12 months Vitals Screenings to include cognitive, depression, and falls Referrals and appointments  In addition, I have reviewed and discussed with patient certain preventive protocols, quality metrics, and best practice recommendations. A written personalized care plan for preventive services as well as general preventive health recommendations were provided to  patient.     Hal Hope, LPN   9/56/[id number]   Nurse Notes: none

## 2023-05-11 NOTE — Patient Instructions (Signed)
Shelly Fischer , Thank you for taking time to come for your Medicare Wellness Visit. I appreciate your ongoing commitment to your health goals. Please review the following plan we discussed and let me know if I can assist you in the future.   These are the goals we discussed:  Goals      DIET - EAT MORE FRUITS AND VEGETABLES     Patient Stated     Pt states she would like to maintain current health        This is a list of the screening recommended for you and due dates:  Health Maintenance  Topic Date Due   Zoster (Shingles) Vaccine (1 of 2) Never done   COVID-19 Vaccine (3 - Moderna risk series) 04/20/2020   DTaP/Tdap/Td vaccine (2 - Td or Tdap) 06/27/2023   Flu Shot  07/27/2023   Medicare Annual Wellness Visit  05/10/2024   Mammogram  09/07/2024   Colon Cancer Screening  06/13/2027   Pneumonia Vaccine  Completed   DEXA scan (bone density measurement)  Completed   Hepatitis C Screening: USPSTF Recommendation to screen - Ages 44-79 yo.  Completed   HPV Vaccine  Aged Out    Advanced directives: no  Conditions/risks identified: none  Next appointment: Follow up in one year for your annual wellness visit 05/16/24 @ 9:45 am by phone   Preventive Care 65 Years and Older, Female Preventive care refers to lifestyle choices and visits with your health care provider that can promote health and wellness. What does preventive care include? A yearly physical exam. This is also called an annual well check. Dental exams once or twice a year. Routine eye exams. Ask your health care provider how often you should have your eyes checked. Personal lifestyle choices, including: Daily care of your teeth and gums. Regular physical activity. Eating a healthy diet. Avoiding tobacco and drug use. Limiting alcohol use. Practicing safe sex. Taking low-dose aspirin every day. Taking vitamin and mineral supplements as recommended by your health care provider. What happens during an annual well  check? The services and screenings done by your health care provider during your annual well check will depend on your age, overall health, lifestyle risk factors, and family history of disease. Counseling  Your health care provider may ask you questions about your: Alcohol use. Tobacco use. Drug use. Emotional well-being. Home and relationship well-being. Sexual activity. Eating habits. History of falls. Memory and ability to understand (cognition). Work and work Astronomer. Reproductive health. Screening  You may have the following tests or measurements: Height, weight, and BMI. Blood pressure. Lipid and cholesterol levels. These may be checked every 5 years, or more frequently if you are over 11 years old. Skin check. Lung cancer screening. You may have this screening every year starting at age 64 if you have a 30-pack-year history of smoking and currently smoke or have quit within the past 15 years. Fecal occult blood test (FOBT) of the stool. You may have this test every year starting at age 69. Flexible sigmoidoscopy or colonoscopy. You may have a sigmoidoscopy every 5 years or a colonoscopy every 10 years starting at age 105. Hepatitis C blood test. Hepatitis B blood test. Sexually transmitted disease (STD) testing. Diabetes screening. This is done by checking your blood sugar (glucose) after you have not eaten for a while (fasting). You may have this done every 1-3 years. Bone density scan. This is done to screen for osteoporosis. You may have this done starting at age  65. Mammogram. This may be done every 1-2 years. Talk to your health care provider about how often you should have regular mammograms. Talk with your health care provider about your test results, treatment options, and if necessary, the need for more tests. Vaccines  Your health care provider may recommend certain vaccines, such as: Influenza vaccine. This is recommended every year. Tetanus, diphtheria, and  acellular pertussis (Tdap, Td) vaccine. You may need a Td booster every 10 years. Zoster vaccine. You may need this after age 47. Pneumococcal 13-valent conjugate (PCV13) vaccine. One dose is recommended after age 89. Pneumococcal polysaccharide (PPSV23) vaccine. One dose is recommended after age 86. Talk to your health care provider about which screenings and vaccines you need and how often you need them. This information is not intended to replace advice given to you by your health care provider. Make sure you discuss any questions you have with your health care provider. Document Released: 01/08/2016 Document Revised: 08/31/2016 Document Reviewed: 10/13/2015 Elsevier Interactive Patient Education  2017 [place] Prevention in the Home Falls can cause injuries. They can happen to people of all ages. There are many things you can do to make your home safe and to help prevent falls. What can I do on the outside of my home? Regularly fix the edges of walkways and driveways and fix any cracks. Remove anything that might make you trip as you walk through a door, such as a raised step or threshold. Trim any bushes or trees on the path to your home. Use bright outdoor lighting. Clear any walking paths of anything that might make someone trip, such as rocks or tools. Regularly check to see if handrails are loose or broken. Make sure that both sides of any steps have handrails. Any raised decks and porches should have guardrails on the edges. Have any leaves, snow, or ice cleared regularly. Use sand or salt on walking paths during winter. Clean up any spills in your garage right away. This includes oil or grease spills. What can I do in the bathroom? Use night lights. Install grab bars by the toilet and in the tub and shower. Do not use towel bars as grab bars. Use non-skid mats or decals in the tub or shower. If you need to sit down in the shower, use a plastic, non-slip stool. Keep  the floor dry. Clean up any water that spills on the floor as soon as it happens. Remove soap buildup in the tub or shower regularly. Attach bath mats securely with double-sided non-slip rug tape. Do not have throw rugs and other things on the floor that can make you trip. What can I do in the bedroom? Use night lights. Make sure that you have a light by your bed that is easy to reach. Do not use any sheets or blankets that are too big for your bed. They should not hang down onto the floor. Have a firm chair that has side arms. You can use this for support while you get dressed. Do not have throw rugs and other things on the floor that can make you trip. What can I do in the kitchen? Clean up any spills right away. Avoid walking on wet floors. Keep items that you use a lot in easy-to-reach places. If you need to reach something above you, use a strong step stool that has a grab bar. Keep electrical cords out of the way. Do not use floor polish or wax that makes floors slippery.  If you must use wax, use non-skid floor wax. Do not have throw rugs and other things on the floor that can make you trip. What can I do with my stairs? Do not leave any items on the stairs. Make sure that there are handrails on both sides of the stairs and use them. Fix handrails that are broken or loose. Make sure that handrails are as long as the stairways. Check any carpeting to make sure that it is firmly attached to the stairs. Fix any carpet that is loose or worn. Avoid having throw rugs at the top or bottom of the stairs. If you do have throw rugs, attach them to the floor with carpet tape. Make sure that you have a light switch at the top of the stairs and the bottom of the stairs. If you do not have them, ask someone to add them for you. What else can I do to help prevent falls? Wear shoes that: Do not have high heels. Have rubber bottoms. Are comfortable and fit you well. Are closed at the toe. Do not  wear sandals. If you use a stepladder: Make sure that it is fully opened. Do not climb a closed stepladder. Make sure that both sides of the stepladder are locked into place. Ask someone to hold it for you, if possible. Clearly mark and make sure that you can see: Any grab bars or handrails. First and last steps. Where the edge of each step is. Use tools that help you move around (mobility aids) if they are needed. These include: Canes. Walkers. Scooters. Crutches. Turn on the lights when you go into a dark area. Replace any light bulbs as soon as they burn out. Set up your furniture so you have a clear path. Avoid moving your furniture around. If any of your floors are uneven, fix them. If there are any pets around you, be aware of where they are. Review your medicines with your doctor. Some medicines can make you feel dizzy. This can increase your chance of falling. Ask your doctor what other things that you can do to help prevent falls. This information is not intended to replace advice given to you by your health care provider. Make sure you discuss any questions you have with your health care provider. Document Released: 10/08/2009 Document Revised: 05/19/2016 Document Reviewed: 01/16/2015 Elsevier Interactive Patient Education  2017 [employer].

## 2023-08-07 ENCOUNTER — Encounter: Payer: Medicare PPO | Admitting: Internal Medicine

## 2023-08-08 ENCOUNTER — Encounter: Payer: Self-pay | Admitting: Internal Medicine

## 2023-08-09 ENCOUNTER — Ambulatory Visit: Payer: Medicare PPO | Admitting: Internal Medicine

## 2023-08-09 ENCOUNTER — Encounter: Payer: Self-pay | Admitting: Internal Medicine

## 2023-08-09 VITALS — BP 128/76 | HR 80 | Ht 63.0 in | Wt 144.0 lb

## 2023-08-09 DIAGNOSIS — Z0000 Encounter for general adult medical examination without abnormal findings: Secondary | ICD-10-CM

## 2023-08-09 DIAGNOSIS — E785 Hyperlipidemia, unspecified: Secondary | ICD-10-CM | POA: Diagnosis not present

## 2023-08-09 DIAGNOSIS — R7989 Other specified abnormal findings of blood chemistry: Secondary | ICD-10-CM | POA: Diagnosis not present

## 2023-08-09 DIAGNOSIS — M8580 Other specified disorders of bone density and structure, unspecified site: Secondary | ICD-10-CM

## 2023-08-09 DIAGNOSIS — I10 Essential (primary) hypertension: Secondary | ICD-10-CM | POA: Diagnosis not present

## 2023-08-09 DIAGNOSIS — Z23 Encounter for immunization: Secondary | ICD-10-CM

## 2023-08-09 DIAGNOSIS — Z1231 Encounter for screening mammogram for malignant neoplasm of breast: Secondary | ICD-10-CM | POA: Diagnosis not present

## 2023-08-09 DIAGNOSIS — R0981 Nasal congestion: Secondary | ICD-10-CM | POA: Diagnosis not present

## 2023-08-09 LAB — POCT URINALYSIS DIPSTICK
Bilirubin, UA: NEGATIVE
Blood, UA: NEGATIVE
Glucose, UA: NEGATIVE
Ketones, UA: NEGATIVE
Leukocytes, UA: NEGATIVE
Nitrite, UA: NEGATIVE
Protein, UA: NEGATIVE
Spec Grav, UA: 1.015 (ref 1.010–1.025)
Urobilinogen, UA: 0.2 E.U./dL
pH, UA: 6 (ref 5.0–8.0)

## 2023-08-09 NOTE — Patient Instructions (Addendum)
Try Coricidin HBP for congestion - use as needed.  You can continue to take the allergy medication as well.  Take calcium + D supplement ( 500-600 calcium and 400-600 D) - take 2 per day  Call [facility] to schedule your mammogram at [phone].

## 2023-08-09 NOTE — Assessment & Plan Note (Signed)
 Normal exam with stable BP on losartan hct. No concerns or side effects to current medication. No change in regimen; continue low sodium diet.

## 2023-08-09 NOTE — Assessment & Plan Note (Signed)
Lipids managed with diet alone. 10 yr risk elevated but patient declines statins

## 2023-08-09 NOTE — Progress Notes (Signed)
Date:  08/09/2023   Name:  Shelly Fischer   DOB:  1951/03/10   MRN:  [id number]   Chief Complaint: Annual Exam Shelly Fischer is a 72 y.o. female who presents today for her Complete Annual Exam. She feels well. She reports exercising - walk when weather permits. She reports she is sleeping well. Breast complaints - none.  Mammogram: 08/2022 DEXA: 08/2022 osteopenia Colonoscopy: 05/2017 repeat 10 yrs  Health Maintenance Due  Topic Date Due   Zoster Vaccines- Shingrix (1 of 2) Never done   COVID-19 Vaccine (3 - Moderna risk series) 04/20/2020   DTaP/Tdap/Td (2 - Td or Tdap) 06/27/2023   INFLUENZA VACCINE  07/27/2023    Immunization History  Administered Date(s) Administered   Influenza, High Dose Seasonal PF 10/19/2017   Influenza-Unspecified 10/16/2019   Moderna Sars-Covid-2 Vaccination 02/24/2020, 03/23/2020   PNEUMOCOCCAL CONJUGATE-20 08/09/2023   Pneumococcal Conjugate-13 03/18/2016   Pneumococcal Polysaccharide-23 04/06/2017   Tdap 06/26/2013    Hypertension This is a chronic problem. The problem is controlled. Pertinent negatives include no chest pain, headaches, palpitations or shortness of breath. Past treatments include angiotensin blockers and diuretics.  Sinus Problem This is a recurrent problem. There has been no fever. Associated symptoms include congestion. Pertinent negatives include no chills, coughing, headaches or shortness of breath. Treatments tried: antihistamines.    Lab Results  Component Value Date   NA 140 01/23/2023   K 3.7 01/23/2023   CO2 25 01/23/2023   GLUCOSE 122 (H) 01/23/2023   BUN 11 01/23/2023   CREATININE 0.66 01/23/2023   CALCIUM 9.4 01/23/2023   EGFR 93 01/23/2023   GFRNONAA 79 07/10/2020   Lab Results  Component Value Date   CHOL 218 (H) 07/21/2022   HDL 65 07/21/2022   LDLCALC 129 (H) 07/21/2022   TRIG 139 07/21/2022   CHOLHDL 3.4 07/21/2022   Lab Results  Component Value Date   TSH 5.610 (H) 07/21/2022   No results  found for: "HGBA1C" Lab Results  Component Value Date   WBC 4.6 07/21/2022   HGB 13.7 07/21/2022   HCT 41.1 07/21/2022   MCV 87 07/21/2022   PLT 236 07/21/2022   Lab Results  Component Value Date   ALT 12 01/23/2023   AST 17 01/23/2023   ALKPHOS 184 (H) 01/23/2023   BILITOT 0.3 01/23/2023   No results found for: "25OHVITD2", "25OHVITD3", "VD25OH"   Review of Systems  Constitutional:  Negative for chills, fatigue and fever.  HENT:  Positive for congestion. Negative for hearing loss, tinnitus, trouble swallowing and voice change.   Eyes:  Negative for visual disturbance.  Respiratory:  Negative for cough, chest tightness, shortness of breath and wheezing.   Cardiovascular:  Negative for chest pain, palpitations and leg swelling.  Gastrointestinal:  Negative for abdominal pain, constipation, diarrhea and vomiting.  Endocrine: Negative for polydipsia and polyuria.  Genitourinary:  Negative for dysuria, frequency, genital sores, vaginal bleeding and vaginal discharge.  Musculoskeletal:  Negative for arthralgias, gait problem and joint swelling.  Skin:  Negative for color change and rash.  Neurological:  Negative for dizziness, tremors, light-headedness and headaches.  Hematological:  Negative for adenopathy. Does not bruise/bleed easily.  Psychiatric/Behavioral:  Negative for dysphoric mood and sleep disturbance. The patient is not nervous/anxious.     Patient Active Problem List   Diagnosis Date Noted   Osteopenia determined by x-ray 08/09/2023   Elevated alkaline phosphatase level 01/23/2023   Carpal tunnel syndrome 11/21/2015   Dyslipidemia 11/21/2015   Essential (  primary) hypertension 11/21/2015   Hot flash, menopausal 11/21/2015   B12 nutritional deficiency 11/21/2015   Allergic rhinitis, seasonal 11/21/2015    Allergies  Allergen Reactions   Calcium Channel Blockers Palpitations   Loratadine Palpitations    Past Surgical History:  Procedure Laterality Date    ABDOMINAL HYSTERECTOMY  1990   endometriosis   COLONOSCOPY WITH PROPOFOL N/A 06/12/2017   Procedure: COLONOSCOPY WITH PROPOFOL;  Surgeon: Midge Minium, MD;  Location: [facility];  Service: Endoscopy;  Laterality: N/A;   FLEXIBLE SIGMOIDOSCOPY      Social History   Tobacco Use   Smoking status: Never   Smokeless tobacco: Never  Vaping Use   Vaping status: Never Used  Substance Use Topics   Alcohol use: No    Alcohol/week: 0.0 standard drinks of alcohol   Drug use: No     Medication list has been reviewed and updated.  Current Meds  Medication Sig   cyanocobalamin (VITAMIN B12) 1000 MCG/ML injection INJECT 1 ML (1,000 MCG) INTRAMUSCULARLY EVERY 30 DAYS   hydrochlorothiazide (HYDRODIURIL) 25 MG tablet Take 1 tablet (25 mg total) by mouth daily.   ketoconazole (NIZORAL) 2 % shampoo Apply topically 2 (two) times a week.   losartan (COZAAR) 100 MG tablet Take 1 tablet (100 mg total) by mouth daily.       08/09/2023    9:10 AM 01/23/2023    1:23 PM 07/21/2022    9:01 AM 01/20/2022    9:10 AM  GAD 7 : Generalized Anxiety Score  Nervous, Anxious, on Edge 0 0 0 0  Control/stop worrying 1 0 0 0  Worry too much - different things 0 0 1 0  Trouble relaxing 0 0 0 0  Restless 0 0 0 0  Easily annoyed or irritable 0 0 0 0  Afraid - awful might happen 0 0 1 0  Total GAD 7 Score 1 0 2 0  Anxiety Difficulty Not difficult at all Not difficult at all Not difficult at all Not difficult at all       08/09/2023    9:10 AM 05/11/2023   11:30 AM 01/23/2023    1:23 PM  Depression screen PHQ 2/9  Decreased Interest 0  0  Down, Depressed, Hopeless 1 1 0  PHQ - 2 Score 1 1 0  Altered sleeping 1  0  Tired, decreased energy 1  1  Change in appetite 0  0  Feeling bad or failure about yourself  0  0  Trouble concentrating 0  0  Moving slowly or fidgety/restless 0  0  Suicidal thoughts 0  0  PHQ-9 Score 3  1  Difficult doing work/chores Not difficult at all Not difficult at all Not  difficult at all    BP Readings from Last 3 Encounters:  08/09/23 128/76  01/23/23 128/66  07/21/22 124/76    Physical Exam Vitals and nursing note reviewed.  Constitutional:      General: She is not in acute distress.    Appearance: Normal appearance. She is well-developed.  HENT:     Head: Normocephalic and atraumatic.     Right Ear: Tympanic membrane and ear canal normal.     Left Ear: Tympanic membrane and ear canal normal.     Nose:     Right Sinus: No maxillary sinus tenderness or frontal sinus tenderness.     Left Sinus: No maxillary sinus tenderness or frontal sinus tenderness.  Eyes:     General: No scleral icterus.  Right eye: No discharge.        Left eye: No discharge.     Conjunctiva/sclera: Conjunctivae normal.  Neck:     Thyroid: No thyromegaly.     Vascular: No carotid bruit.  Cardiovascular:     Rate and Rhythm: Normal rate and regular rhythm.     Pulses: Normal pulses.     Heart sounds: Normal heart sounds.  Pulmonary:     Effort: Pulmonary effort is normal. No respiratory distress.     Breath sounds: No wheezing.  Chest:     Comments: Breast exam deferred to upcoming mammogram Abdominal:     General: Bowel sounds are normal.     Palpations: Abdomen is soft.     Tenderness: There is no abdominal tenderness.  Musculoskeletal:     Cervical back: Normal range of motion. No erythema.     Right lower leg: No edema.     Left lower leg: No edema.  Lymphadenopathy:     Cervical: No cervical adenopathy.  Skin:    General: Skin is warm and dry.     Findings: No rash.     Comments: SK/AK of the right temple  Neurological:     Mental Status: She is alert and oriented to person, place, and time.     Cranial Nerves: No cranial nerve deficit.     Sensory: No sensory deficit.     Deep Tendon Reflexes: Reflexes are normal and symmetric.  Psychiatric:        Attention and Perception: Attention normal.        Mood and Affect: Mood normal.     Wt  Readings from Last 3 Encounters:  08/09/23 144 lb (65.3 kg)  05/11/23 146 lb (66.2 kg)  01/23/23 146 lb (66.2 kg)    BP 128/76 (BP Location: Left Arm, Cuff Size: Large)   Pulse 80   Ht 5\' 3"  (1.6 m)   Wt 144 lb (65.3 kg)   SpO2 95%   BMI 25.51 kg/m   Assessment and Plan:  Problem List Items Addressed This Visit       Unprioritized   Osteopenia determined by x-ray    Recommend calcium and vitamin D supplement      Relevant Orders   VITAMIN D 25 Hydroxy (Vit-D Deficiency, Fractures)   Essential (primary) hypertension (Chronic)    Normal exam with stable BP on losartan hct. No concerns or side effects to current medication. No change in regimen; continue low sodium diet.       Relevant Orders   CBC with Differential/Platelet   Comprehensive metabolic panel   POCT urinalysis dipstick (Completed)   Dyslipidemia (Chronic)    Lipids managed with diet alone. 10 yr risk elevated but patient declines statins      Relevant Orders   Lipid panel   Other Visit Diagnoses     Annual physical exam    -  Primary   up to date on screenings Prevnar-20 today Schedule Dermatology evaluation   Encounter for screening mammogram for breast cancer       Relevant Orders   MM 3D SCREENING MAMMOGRAM BILATERAL BREAST   Elevated TSH       Relevant Orders   TSH + free T4   Need for vaccination for pneumococcus       Relevant Orders   Pneumococcal conjugate vaccine 20-valent (Completed)   Nasal sinus congestion       continue anti-histamine trial of Coricidin HBP  Return in about 6 months (around 02/09/2024) for HTN.    Reubin Milan, MD [facility] [place]

## 2023-08-09 NOTE — Assessment & Plan Note (Signed)
Recommend calcium and vitamin D supplement

## 2023-08-10 LAB — LIPID PANEL
Chol/HDL Ratio: 3.5 ratio (ref 0.0–4.4)
Cholesterol, Total: 244 mg/dL — ABNORMAL HIGH (ref 100–199)
HDL: 69 mg/dL (ref >39)
LDL Chol Calc (NIH): 147 mg/dL — ABNORMAL HIGH (ref 0–99)
Triglycerides: 156 mg/dL — ABNORMAL HIGH (ref 0–149)
VLDL Cholesterol Cal: 28 mg/dL (ref 5–40)

## 2023-08-10 LAB — CBC WITH DIFFERENTIAL/PLATELET
Basophils Absolute: 0 10*3/uL (ref 0.0–0.2)
Basos: 1 %
EOS (ABSOLUTE): 0.2 10*3/uL (ref 0.0–0.4)
Eos: 5 %
Hematocrit: 42.2 % (ref 34.0–46.6)
Hemoglobin: 13.9 g/dL (ref 11.1–15.9)
Immature Grans (Abs): 0 10*3/uL (ref 0.0–0.1)
Immature Granulocytes: 0 %
Lymphocytes Absolute: 1 10*3/uL (ref 0.7–3.1)
Lymphs: 22 %
MCH: 28.8 pg (ref 26.6–33.0)
MCHC: 32.9 g/dL (ref 31.5–35.7)
MCV: 88 fL (ref 79–97)
Monocytes Absolute: 0.4 10*3/uL (ref 0.1–0.9)
Monocytes: 9 %
Neutrophils Absolute: 2.9 10*3/uL (ref 1.4–7.0)
Neutrophils: 63 %
Platelets: 240 10*3/uL (ref 150–450)
RBC: 4.82 x10E6/uL (ref 3.77–5.28)
RDW: 13.1 % (ref 11.7–15.4)
WBC: 4.6 10*3/uL (ref 3.4–10.8)

## 2023-08-10 LAB — COMPREHENSIVE METABOLIC PANEL
ALT: 19 IU/L (ref 0–32)
AST: 16 IU/L (ref 0–40)
Albumin: 4.5 g/dL (ref 3.8–4.8)
Alkaline Phosphatase: 185 IU/L — ABNORMAL HIGH (ref 44–121)
BUN/Creatinine Ratio: 17 (ref 12–28)
BUN: 12 mg/dL (ref 8–27)
Bilirubin Total: 0.6 mg/dL (ref 0.0–1.2)
CO2: 27 mmol/L (ref 20–29)
Calcium: 9.5 mg/dL (ref 8.7–10.3)
Chloride: 100 mmol/L (ref 96–106)
Creatinine, Ser: 0.69 mg/dL (ref 0.57–1.00)
Globulin, Total: 2.2 g/dL (ref 1.5–4.5)
Glucose: 100 mg/dL — ABNORMAL HIGH (ref 70–99)
Potassium: 3.8 mmol/L (ref 3.5–5.2)
Sodium: 139 mmol/L (ref 134–144)
Total Protein: 6.7 g/dL (ref 6.0–8.5)
eGFR: 91 mL/min/{1.73_m2} (ref >59)

## 2023-08-10 LAB — TSH+FREE T4
Free T4: 1.07 ng/dL (ref 0.82–1.77)
TSH: 5.78 u[IU]/mL — ABNORMAL HIGH (ref 0.450–4.500)

## 2023-08-10 LAB — VITAMIN D 25 HYDROXY (VIT D DEFICIENCY, FRACTURES): Vit D, 25-Hydroxy: 12.6 ng/mL — ABNORMAL LOW (ref 30.0–100.0)

## 2023-08-11 ENCOUNTER — Ambulatory Visit: Payer: Medicare PPO | Admitting: Family Medicine

## 2023-08-11 ENCOUNTER — Encounter: Payer: Self-pay | Admitting: Family Medicine

## 2023-08-11 ENCOUNTER — Ambulatory Visit: Payer: Self-pay

## 2023-08-11 VITALS — BP 138/78 | HR 90 | Ht 63.0 in | Wt 146.0 lb

## 2023-08-11 DIAGNOSIS — B029 Zoster without complications: Secondary | ICD-10-CM | POA: Diagnosis not present

## 2023-08-11 MED ORDER — VALACYCLOVIR HCL 1 G PO TABS
1000.0000 mg | ORAL_TABLET | Freq: Three times a day (TID) | ORAL | 0 refills | Status: AC
Start: 2023-08-11 — End: 2023-08-18

## 2023-08-11 NOTE — Patient Instructions (Signed)
 Shingles  Shingles, or herpes zoster, is an infection. It gives you a skin rash and blisters. These infected areas may hurt a lot. Shingles only happens if: You've had chickenpox. You've been given a shot called a vaccine to protect you from getting chickenpox. Shingles is rare in this case. What are the causes? Shingles is caused by a germ called the varicella-zoster virus. This is the same germ that causes chickenpox. After you're exposed to the germ, it stays in your body but is dormant. This means it isn't active. Shingles happens if the germ becomes active again. This can happen years after you're first exposed to the germ. What increases the risk? You may be more likely to get shingles if: You're older than 74 years of age. You're under a lot of stress. You have a weak immune system. The immune system is your body's defense system. It may be weak if: You have human immunodeficiency virus (HIV). You have acquired immunodeficiency syndrome (AIDS). You have cancer. You take medicines that weaken your immune system. These include organ transplant medicines. What are the signs or symptoms? The first symptoms of shingles may be itching, tingling, or pain. Your skin may feel like it's burning. A few days or weeks later, you'll get a rash. Here's what you can expect: The rash is likely to be on one side of your body. The rash may be shaped like a belt or a band. Over time, it will turn into blisters filled with fluid. The blisters will break open and change into scabs. The scabs will dry up in about 2-3 weeks. You may also have: A fever. Chills. A headache. Nausea. How is this diagnosed? Shingles is diagnosed with a skin exam. A sample called a culture may be taken from one of your blisters and sent to a lab. This will show if you have shingles. How is this treated? The rash may last for several weeks. There's no cure for shingles, but your health care provider may give you medicines.  These medicines may: Help with pain. Help with itching. Help with irritation and swelling. Help you get better sooner. Help to prevent long-term problems. If the rash is on your face, you may need to see an eye doctor or an ear, nose, and throat (ENT) doctor. Follow these instructions at home: Medicines Take your medicines only as told by your provider. Put an anti-itch cream or numbing cream on the rash or blisters as told by your provider. Relieving itching and discomfort  To help with itching: Put cold, wet cloths called cold compresses on the rash or blisters. Take a cool bath. Try adding baking soda or dry oatmeal to the water. Do not bathe in hot water. Use calamine lotion on the rash or blisters. You can get this type of lotion at the store. Blister and rash care Keep your rash covered with a loose bandage. Wear loose clothes that don't rub on your rash. Take care of your rash as told by your provider. Make sure you: Wash your hands with soap and water for at least 20 seconds before and after you change your bandage. If you can't use soap and water, use hand sanitizer. Keep your rash and blisters clean by washing them with mild soap and cool water. Change your bandage. Check your rash every day for signs of infection. Check for: More redness, swelling, or pain. Fluid or blood. Warmth. Pus or a bad smell. Do not scratch your rash. Do not pick at your  blisters. To help you not scratch: Keep your fingernails clean and cut short. Try to wear gloves or mittens when you sleep. General instructions Rest. Wash your hands often with soap and water for at least 20 seconds. If you can't use soap and water, use hand sanitizer. Washing your hands lowers your chance of getting a skin infection. Your infection can cause chickenpox in others. If you have blisters that aren't scabs yet, stay away from: Babies. Pregnant people. Children who have eczema. Older people who have organ  transplants. People who have a long-term, or chronic, illness. Anyone who hasn't had chickenpox before. Anyone who hasn't gotten the chickenpox vaccine. How is this prevented? Vaccines are the best way to prevent you from getting chickenpox or shingles. Talk with your provider about getting these shots. Where to find more information Centers for Disease Control and Prevention (CDC): [employer].no Contact a health care provider if: Your pain doesn't get better with medicine. Your pain doesn't get better after the rash heals. You have any signs of infection around the rash. Your rash or blisters get worse. You have a fever or chills. Get help right away if: The rash is on your face or nose. You have pain in your face or by your eye. You lose feeling on one side of your face. You have trouble seeing. You have ear pain or ringing in your ear. This information is not intended to replace advice given to you by your health care provider. Make sure you discuss any questions you have with your health care provider. Document Revised: 01/27/2023 Document Reviewed: 01/27/2023 Elsevier Patient Education  2024 [employer].

## 2023-08-11 NOTE — Telephone Encounter (Signed)
    Chief Complaint: Blister rash to left side of back Symptoms: Mild pain Frequency: Yesterday Pertinent Negatives: Patient denies fever Disposition: [] ED /[] Urgent Care (no appt availability in office) / [x] Appointment(In office/virtual)/ []  [facility]/ [] Home Care/ [] Refused Recommended Disposition /[] [place] [facility]/ []  Follow-up with PCP Additional Notes: No availability with PCP. Agrees with appointment.  Reason for Disposition  [1] Localized rash is very painful AND [2] no fever  Answer Assessment - Initial Assessment Questions 1. APPEARANCE of RASH: "Describe the rash."      Blisters 2. LOCATION: "Where is the rash located?"      Back - left of spine 3. NUMBER: "How many spots are there?"      4 blisters and small bumps 4. SIZE: "How big are the spots?" (Inches, centimeters or compare to size of a coin)      Small 5. ONSET: "When did the rash start?"      Yesterday 6. ITCHING: "Does the rash itch?" If Yes, ask: "How bad is the itch?"  (Scale 0-10; or none, mild, moderate, severe)     No 7. PAIN: "Does the rash hurt?" If Yes, ask: "How bad is the pain?"  (Scale 0-10; or none, mild, moderate, severe)    - NONE (0): no pain    - MILD (1-3): doesn't interfere with normal activities     - MODERATE (4-7): interferes with normal activities or awakens from sleep     - SEVERE (8-10): excruciating pain, unable to do any normal activities     None 8. OTHER SYMPTOMS: "Do you have any other symptoms?" (e.g., fever)     No 9. PREGNANCY: "Is there any chance you are pregnant?" "When was your last menstrual period?"     No  Protocols used: Rash or Redness - Localized-A-AH

## 2023-08-11 NOTE — Progress Notes (Signed)
Date:  08/11/2023   Name:  Shelly Fischer   DOB:  04-05-1951   MRN:  [id number]   Chief Complaint: Rash (Patch of blisters on back since Wednesday afternoon)  HPI  Lab Results  Component Value Date   NA 139 08/09/2023   K 3.8 08/09/2023   CO2 27 08/09/2023   GLUCOSE 100 (H) 08/09/2023   BUN 12 08/09/2023   CREATININE 0.69 08/09/2023   CALCIUM 9.5 08/09/2023   EGFR 91 08/09/2023   GFRNONAA 79 07/10/2020   Lab Results  Component Value Date   CHOL 244 (H) 08/09/2023   HDL 69 08/09/2023   LDLCALC 147 (H) 08/09/2023   TRIG 156 (H) 08/09/2023   CHOLHDL 3.5 08/09/2023   Lab Results  Component Value Date   TSH 5.780 (H) 08/09/2023   No results found for: "HGBA1C" Lab Results  Component Value Date   WBC 4.6 08/09/2023   HGB 13.9 08/09/2023   HCT 42.2 08/09/2023   MCV 88 08/09/2023   PLT 240 08/09/2023   Lab Results  Component Value Date   ALT 19 08/09/2023   AST 16 08/09/2023   ALKPHOS 185 (H) 08/09/2023   BILITOT 0.6 08/09/2023   Lab Results  Component Value Date   VD25OH 12.6 (L) 08/09/2023     Review of Systems  Patient Active Problem List   Diagnosis Date Noted   Osteopenia determined by x-ray 08/09/2023   Elevated alkaline phosphatase level 01/23/2023   Carpal tunnel syndrome 11/21/2015   Dyslipidemia 11/21/2015   Essential (primary) hypertension 11/21/2015   Hot flash, menopausal 11/21/2015   B12 nutritional deficiency 11/21/2015   Allergic rhinitis, seasonal 11/21/2015    Allergies  Allergen Reactions   Calcium Channel Blockers Palpitations   Loratadine Palpitations    Past Surgical History:  Procedure Laterality Date   ABDOMINAL HYSTERECTOMY  1990   endometriosis   COLONOSCOPY WITH PROPOFOL N/A 06/12/2017   Procedure: COLONOSCOPY WITH PROPOFOL;  Surgeon: Midge Minium, MD;  Location: [facility];  Service: Endoscopy;  Laterality: N/A;   FLEXIBLE SIGMOIDOSCOPY      Social History   Tobacco Use   Smoking status: Never    Smokeless tobacco: Never  Vaping Use   Vaping status: Never Used  Substance Use Topics   Alcohol use: No    Alcohol/week: 0.0 standard drinks of alcohol   Drug use: No     Medication list has been reviewed and updated.  Current Meds  Medication Sig   cyanocobalamin (VITAMIN B12) 1000 MCG/ML injection INJECT 1 ML (1,000 MCG) INTRAMUSCULARLY EVERY 30 DAYS   hydrochlorothiazide (HYDRODIURIL) 25 MG tablet Take 1 tablet (25 mg total) by mouth daily.   ketoconazole (NIZORAL) 2 % shampoo Apply topically 2 (two) times a week.   losartan (COZAAR) 100 MG tablet Take 1 tablet (100 mg total) by mouth daily.       08/09/2023    9:10 AM 01/23/2023    1:23 PM 07/21/2022    9:01 AM 01/20/2022    9:10 AM  GAD 7 : Generalized Anxiety Score  Nervous, Anxious, on Edge 0 0 0 0  Control/stop worrying 1 0 0 0  Worry too much - different things 0 0 1 0  Trouble relaxing 0 0 0 0  Restless 0 0 0 0  Easily annoyed or irritable 0 0 0 0  Afraid - awful might happen 0 0 1 0  Total GAD 7 Score 1 0 2 0  Anxiety Difficulty Not difficult at  all Not difficult at all Not difficult at all Not difficult at all       08/09/2023    9:10 AM 05/11/2023   11:30 AM 01/23/2023    1:23 PM  Depression screen PHQ 2/9  Decreased Interest 0  0  Down, Depressed, Hopeless 1 1 0  PHQ - 2 Score 1 1 0  Altered sleeping 1  0  Tired, decreased energy 1  1  Change in appetite 0  0  Feeling bad or failure about yourself  0  0  Trouble concentrating 0  0  Moving slowly or fidgety/restless 0  0  Suicidal thoughts 0  0  PHQ-9 Score 3  1  Difficult doing work/chores Not difficult at all Not difficult at all Not difficult at all    BP Readings from Last 3 Encounters:  08/11/23 138/78  08/09/23 128/76  01/23/23 128/66    Physical Exam Vitals and nursing note reviewed.  Cardiovascular:     Heart sounds: No murmur heard.    No friction rub. No gallop.  Pulmonary:     Breath sounds: No wheezing, rhonchi or rales.   Skin:    Findings: Rash present.     Comments: Cluster of vesicles of the back not previously to the left or right so it may be involving T1 which may have progressed to the arm area.     Wt Readings from Last 3 Encounters:  08/11/23 146 lb (66.2 kg)  08/09/23 144 lb (65.3 kg)  05/11/23 146 lb (66.2 kg)    BP 138/78   Pulse 90   Ht 5\' 3"  (1.6 m)   Wt 146 lb (66.2 kg)   SpO2 98%   BMI 25.86 kg/m   Assessment and Plan: 1. Varicella zoster New onset.  Persistent.  Described as irritating.  Patient has a cluster of clustered cysts and a T2 dermatome posterior which likely is going to spread to her left.  It is midline or near midline so it could possibly go to the other side I have discussed this with the patient.  The pain is not severe and she will proceed with naproxen/Aleve as directed and we will initiate Valtrex 1 g 3 times a day for 7 days.  Patient BUN and creatinine was consulted prior and that she is in the range that she could have maximum dosing.  Information was provided on varicella-zoster. - valACYclovir (VALTREX) 1000 MG tablet; Take 1 tablet (1,000 mg total) by mouth 3 (three) times daily for 7 days.  Dispense: 21 tablet; Refill: 0     Elizabeth Sauer, MD

## 2023-09-06 ENCOUNTER — Other Ambulatory Visit: Payer: Self-pay | Admitting: Internal Medicine

## 2023-09-06 DIAGNOSIS — I10 Essential (primary) hypertension: Secondary | ICD-10-CM

## 2023-09-14 ENCOUNTER — Ambulatory Visit
Admission: RE | Admit: 2023-09-14 | Discharge: 2023-09-14 | Disposition: A | Discharge status: Home / Self Care | Payer: Medicare PPO | Source: Ambulatory Visit | Attending: Internal Medicine | Admitting: Internal Medicine

## 2023-09-14 DIAGNOSIS — Z1231 Encounter for screening mammogram for malignant neoplasm of breast: Secondary | ICD-10-CM | POA: Insufficient documentation

## 2023-09-15 ENCOUNTER — Other Ambulatory Visit: Payer: Self-pay | Admitting: Internal Medicine

## 2023-09-15 DIAGNOSIS — R928 Other abnormal and inconclusive findings on diagnostic imaging of breast: Secondary | ICD-10-CM

## 2023-09-20 ENCOUNTER — Ambulatory Visit
Admission: RE | Admit: 2023-09-20 | Discharge: 2023-09-20 | Disposition: A | Discharge status: Home / Self Care | Payer: Medicare PPO | Source: Ambulatory Visit | Attending: Internal Medicine | Admitting: Internal Medicine

## 2023-09-20 DIAGNOSIS — R928 Other abnormal and inconclusive findings on diagnostic imaging of breast: Secondary | ICD-10-CM | POA: Diagnosis not present

## 2023-09-20 DIAGNOSIS — R92322 Mammographic fibroglandular density, left breast: Secondary | ICD-10-CM | POA: Diagnosis not present

## 2024-01-31 ENCOUNTER — Ambulatory Visit: Payer: Medicare PPO | Admitting: Internal Medicine

## 2024-02-01 ENCOUNTER — Ambulatory Visit: Payer: Medicare PPO | Admitting: Internal Medicine

## 2024-02-23 ENCOUNTER — Encounter: Payer: Self-pay | Admitting: Internal Medicine

## 2024-02-23 ENCOUNTER — Ambulatory Visit: Payer: Medicare PPO | Admitting: Internal Medicine

## 2024-02-23 VITALS — BP 132/70 | HR 80 | Ht 63.0 in | Wt 148.2 lb

## 2024-02-23 DIAGNOSIS — I10 Essential (primary) hypertension: Secondary | ICD-10-CM | POA: Diagnosis not present

## 2024-02-23 NOTE — Progress Notes (Signed)
 Date:  02/23/2024   Name:  Shelly Fischer   DOB:  September 17, 1951   MRN:  [id number]   Chief Complaint: Hypertension  Hypertension This is a chronic problem. The problem is resistant. Pertinent negatives include no chest pain, headaches, palpitations or shortness of breath. Past treatments include angiotensin blockers and diuretics.    Review of Systems  Constitutional:  Negative for fatigue and unexpected weight change.  HENT:  Negative for nosebleeds.   Eyes:  Negative for visual disturbance.  Respiratory:  Negative for cough, chest tightness, shortness of breath and wheezing.   Cardiovascular:  Negative for chest pain, palpitations and leg swelling.  Gastrointestinal:  Negative for abdominal pain, constipation and diarrhea.  Neurological:  Negative for dizziness, weakness, light-headedness and headaches.     Lab Results  Component Value Date   NA 139 08/09/2023   K 3.8 08/09/2023   CO2 27 08/09/2023   GLUCOSE 100 (H) 08/09/2023   BUN 12 08/09/2023   CREATININE 0.69 08/09/2023   CALCIUM 9.5 08/09/2023   EGFR 91 08/09/2023   GFRNONAA 79 07/10/2020   Lab Results  Component Value Date   CHOL 244 (H) 08/09/2023   HDL 69 08/09/2023   LDLCALC 147 (H) 08/09/2023   TRIG 156 (H) 08/09/2023   CHOLHDL 3.5 08/09/2023   Lab Results  Component Value Date   TSH 5.780 (H) 08/09/2023   No results found for: "HGBA1C" Lab Results  Component Value Date   WBC 4.6 08/09/2023   HGB 13.9 08/09/2023   HCT 42.2 08/09/2023   MCV 88 08/09/2023   PLT 240 08/09/2023   Lab Results  Component Value Date   ALT 19 08/09/2023   AST 16 08/09/2023   ALKPHOS 185 (H) 08/09/2023   BILITOT 0.6 08/09/2023   Lab Results  Component Value Date   VD25OH 12.6 (L) 08/09/2023     Patient Active Problem List   Diagnosis Date Noted   Osteopenia determined by x-ray 08/09/2023   Elevated alkaline phosphatase level 01/23/2023   Carpal tunnel syndrome 11/21/2015   Dyslipidemia 11/21/2015    Essential (primary) hypertension 11/21/2015   Hot flash, menopausal 11/21/2015   B12 nutritional deficiency 11/21/2015   Allergic rhinitis, seasonal 11/21/2015    Allergies  Allergen Reactions   Calcium Channel Blockers Palpitations   Loratadine Palpitations    Past Surgical History:  Procedure Laterality Date   ABDOMINAL HYSTERECTOMY  1990   endometriosis   COLONOSCOPY WITH PROPOFOL N/A 06/12/2017   Procedure: COLONOSCOPY WITH PROPOFOL;  Surgeon: Midge Minium, MD;  Location: [facility];  Service: Endoscopy;  Laterality: N/A;   FLEXIBLE SIGMOIDOSCOPY      Social History   Tobacco Use   Smoking status: Never   Smokeless tobacco: Never  Vaping Use   Vaping status: Never Used  Substance Use Topics   Alcohol use: No    Alcohol/week: 0.0 standard drinks of alcohol   Drug use: No     Medication list has been reviewed and updated.  Current Meds  Medication Sig   cholecalciferol (VITAMIN D3) 25 MCG (1000 UNIT) tablet Take 1,000 Units by mouth daily.   cyanocobalamin (VITAMIN B12) 1000 MCG/ML injection INJECT 1 ML (1,000 MCG) INTRAMUSCULARLY EVERY 30 DAYS   ketoconazole (NIZORAL) 2 % shampoo Apply topically 2 (two) times a week.   losartan (COZAAR) 100 MG tablet TAKE 1 TABLET BY MOUTH EVERY DAY   [DISCONTINUED] hydrochlorothiazide (HYDRODIURIL) 25 MG tablet Take 1 tablet (25 mg total) by mouth daily.  02/23/2024    3:23 PM 08/09/2023    9:10 AM 01/23/2023    1:23 PM 07/21/2022    9:01 AM  GAD 7 : Generalized Anxiety Score  Nervous, Anxious, on Edge 0 0 0 0  Control/stop worrying 0 1 0 0  Worry too much - different things 0 0 0 1  Trouble relaxing 0 0 0 0  Restless 0 0 0 0  Easily annoyed or irritable 0 0 0 0  Afraid - awful might happen 0 0 0 1  Total GAD 7 Score 0 1 0 2  Anxiety Difficulty Not difficult at all Not difficult at all Not difficult at all Not difficult at all       02/23/2024    3:23 PM 08/09/2023    9:10 AM 05/11/2023   11:30 AM   Depression screen PHQ 2/9  Decreased Interest 0 0   Down, Depressed, Hopeless 0 1 1  PHQ - 2 Score 0 1 1  Altered sleeping 1 1   Tired, decreased energy 1 1   Change in appetite 0 0   Feeling bad or failure about yourself  0 0   Trouble concentrating 0 0   Moving slowly or fidgety/restless 0 0   Suicidal thoughts 0 0   PHQ-9 Score 2 3   Difficult doing work/chores Not difficult at all Not difficult at all Not difficult at all    BP Readings from Last 3 Encounters:  02/23/24 132/70  08/11/23 138/78  08/09/23 128/76    Physical Exam Vitals and nursing note reviewed.  Constitutional:      General: She is not in acute distress.    Appearance: She is well-developed.  HENT:     Head: Normocephalic and atraumatic.  Cardiovascular:     Rate and Rhythm: Normal rate and regular rhythm.  Pulmonary:     Effort: Pulmonary effort is normal. No respiratory distress.     Breath sounds: No wheezing or rhonchi.  Musculoskeletal:     Cervical back: Normal range of motion.     Right lower leg: No edema.     Left lower leg: No edema.  Skin:    General: Skin is warm and dry.     Findings: No rash.  Neurological:     Mental Status: She is alert and oriented to person, place, and time.  Psychiatric:        Mood and Affect: Mood normal.        Behavior: Behavior normal.     Wt Readings from Last 3 Encounters:  02/23/24 148 lb 3.2 oz (67.2 kg)  08/11/23 146 lb (66.2 kg)  08/09/23 144 lb (65.3 kg)    BP 132/70   Pulse 80   Ht 5\' 3"  (1.6 m)   Wt 148 lb 3.2 oz (67.2 kg)   SpO2 97%   BMI 26.25 kg/m   Assessment and Plan:  Problem List Items Addressed This Visit       Unprioritized   Essential (primary) hypertension - Primary (Chronic)   Follow up on BP today. On Losartan.  No compliance issues.        No follow-ups on file.    Reubin Milan, MD [facility] [place]

## 2024-02-23 NOTE — Patient Instructions (Signed)
 Goal BP is less than 130/80

## 2024-02-23 NOTE — Assessment & Plan Note (Addendum)
 Follow up on BP today. On Losartan.  No compliance issues.

## 2024-03-03 ENCOUNTER — Other Ambulatory Visit: Payer: Self-pay | Admitting: Internal Medicine

## 2024-03-03 DIAGNOSIS — L209 Atopic dermatitis, unspecified: Secondary | ICD-10-CM

## 2024-03-04 NOTE — Telephone Encounter (Signed)
 Requested medications are due for refill today.  yes  Requested medications are on the active medications list.  yes  Last refill. Nizoral 01/23/2023 3 rf, Vitamin 02/12/2023 3mL 3 rf  Future visit scheduled.   yes  Notes to clinic.  Labs are expired.  Not delegated.    Requested Prescriptions  Pending Prescriptions Disp Refills   cyanocobalamin (VITAMIN B12) 1000 MCG/ML injection [Pharmacy Med Name: CYANOCOBALAMIN 1,000 MCG/ML VL] 3 mL 3    Sig: INJECT 1 ML (1,000 MCG) INTRAMUSCULARLY EVERY 30 DAYS     Endocrinology:  Vitamins - Vitamin B12 Failed - 03/04/2024  5:20 PM      Failed - B12 Level in normal range and within 360 days    Vitamin B-12  Date Value Ref Range Status  07/21/2022 767 232 - 1,245 pg/mL Final         Passed - HCT in normal range and within 360 days    Hematocrit  Date Value Ref Range Status  08/09/2023 42.2 34.0 - 46.6 % Final         Passed - HGB in normal range and within 360 days    Hemoglobin  Date Value Ref Range Status  08/09/2023 13.9 11.1 - 15.9 g/dL Final         Passed - Valid encounter within last 12 months    Recent Outpatient Visits           6 months ago Varicella zoster   [place] [facility], [facility]   6 months ago Annual physical exam   [facility] at [facility], Nyoka Cowden, MD   1 year ago Essential (primary) hypertension   [place] [facility] at [facility], Nyoka Cowden, MD   1 year ago Annual physical exam   [facility] at [facility], Nyoka Cowden, MD   2 years ago Essential (primary) hypertension   [place] [facility] at [facility], Nyoka Cowden, MD       Future Appointments             In 5 months Judithann Graves, Nyoka Cowden, MD [facility] at [facility] [place], [facility]             ketoconazole  (NIZORAL) 2 % shampoo [Pharmacy Med Name: KETOCONAZOLE 2% SHAMPOO] 360 mL 3    Sig: APPLY TOPICALLY 2 TIMES A WEEK     Not Delegated - Over the Counter: OTC 2 Failed - 03/04/2024  5:20 PM      Failed - This refill cannot be delegated      Passed - Valid encounter within last 12 months    Recent Outpatient Visits           6 months ago Varicella zoster   [place] [facility], MD   6 months ago Annual physical exam   [facility] at [facility], Nyoka Cowden, MD   1 year ago Essential (primary) hypertension   [place] [facility] at [facility], Nyoka Cowden, MD   1 year ago Annual physical exam   [facility] at [facility], Nyoka Cowden, MD   2 years ago Essential (primary) hypertension  [facility] at [facility], Nyoka Cowden, MD       Future Appointments             In 5 months Judithann Graves, Nyoka Cowden, MD [facility] at [facility], [facility]

## 2024-03-05 NOTE — Telephone Encounter (Signed)
 Med refill

## 2024-05-22 ENCOUNTER — Ambulatory Visit: Admitting: Emergency Medicine

## 2024-05-22 VITALS — Ht 63.0 in | Wt 143.0 lb

## 2024-05-22 DIAGNOSIS — Z0000 Encounter for general adult medical examination without abnormal findings: Secondary | ICD-10-CM

## 2024-05-22 DIAGNOSIS — Z1231 Encounter for screening mammogram for malignant neoplasm of breast: Secondary | ICD-10-CM

## 2024-05-22 NOTE — Patient Instructions (Addendum)
 Shelly Fischer , Thank you for taking time out of your busy schedule to complete your Annual Wellness Visit with me. I enjoyed our conversation and look forward to speaking with you again next year. I, as well as your care team,  appreciate your ongoing commitment to your health goals. Please review the following plan we discussed and let me know if I can assist you in the future. Your Game plan/ To Do List    Referrals:  None  Follow up Visits: Next Medicare AWV with our clinical staff: 05/28/25 @ 10:10am (phone visit)   Have you seen your provider in the last 6 months (3 months if uncontrolled diabetes)? Yes Next Office Visit with your provider: 08/14/24 @ 9:20am with Dr. Gala Jubilee  Clinician Recommendations: I have placed an order for a mammogram (due around 09/14/24). Call [phone] to schedule at your convenience. Aim for 30 minutes of exercise or brisk walking, 6-8 glasses of water , and 5 servings of fruits and vegetables each day.       This is a list of the screening recommended for you and due dates:  Health Maintenance  Topic Date Due   Zoster (Shingles) Vaccine (1 of 2) Never done   COVID-19 Vaccine (3 - Moderna risk series) 04/20/2020   DTaP/Tdap/Td vaccine (2 - Td or Tdap) 06/27/2023   Flu Shot  07/26/2024   Mammogram  09/13/2024   Medicare Annual Wellness Visit  05/22/2025   Colon Cancer Screening  06/13/2027   DEXA scan (bone density measurement)  09/08/2027   Pneumonia Vaccine  Completed   Hepatitis C Screening  Completed   HPV Vaccine  Aged Out   Meningitis B Vaccine  Aged Out    Advanced directives: (Copy Requested) Please bring a copy of your health care power of attorney and living will to the office to be added to your chart at your convenience. You can mail to [facility] [address] [place], [place] [address] or email to [email or web address] Advance Care Planning is important because it:  [x]  Makes sure you receive the medical care that is  consistent with your values, goals, and preferences  [x]  It provides guidance to your family and loved ones and reduces their decisional burden about whether or not they are making the right decisions based on your wishes.  Follow the link provided in your after visit summary or read over the paperwork we have mailed to you to help you started getting your Advance Directives in place. If you need assistance in completing these, please reach out to us  so that we can help you!  See attachments for Preventive Care and Fall Prevention Tips.   Fall Prevention in the Home, Adult Falls can cause injuries and affect people of all ages. There are many simple things that you can do to make your home safe and to help prevent falls. If you need it, ask for help making these changes. What actions can I take to prevent falls? General information Use good lighting in all rooms. Make sure to: Replace any light bulbs that burn out. Turn on lights if it is dark and use night-lights. Keep items that you use often in easy-to-reach places. Lower the shelves around your home if needed. Move furniture so that there are clear paths around it. Do not keep throw rugs or other things on the floor that can make you trip. If any of your floors are uneven, fix them. Add color or contrast paint or tape to  clearly mark and help you see: Grab bars or handrails. First and last steps of staircases. Where the edge of each step is. If you use a ladder or stepladder: Make sure that it is fully opened. Do not climb a closed ladder. Make sure the sides of the ladder are locked in place. Have someone hold the ladder while you use it. Know where your pets are as you move through your home. What can I do in the bathroom?     Keep the floor dry. Clean up any water  that is on the floor right away. Remove soap buildup in the bathtub or shower. Buildup makes bathtubs and showers slippery. Use non-skid mats or decals on the  floor of the bathtub or shower. Attach bath mats securely with double-sided, non-slip rug tape. If you need to sit down while you are in the shower, use a non-slip stool. Install grab bars by the toilet and in the bathtub and shower. Do not use towel bars as grab bars. What can I do in the bedroom? Make sure that you have a light by your bed that is easy to reach. Do not use any sheets or blankets on your bed that hang to the floor. Have a firm bench or chair with side arms that you can use for support when you get dressed. What can I do in the kitchen? Clean up any spills right away. If you need to reach something above you, use a sturdy step stool that has a grab bar. Keep electrical cables out of the way. Do not use floor polish or wax that makes floors slippery. What can I do with my stairs? Do not leave anything on the stairs. Make sure that you have a light switch at the top and the bottom of the stairs. Have them installed if you do not have them. Make sure that there are handrails on both sides of the stairs. Fix handrails that are broken or loose. Make sure that handrails are as long as the staircases. Install non-slip stair treads on all stairs in your home if they do not have carpet. Avoid having throw rugs at the top or bottom of stairs, or secure the rugs with carpet tape to prevent them from moving. Choose a carpet design that does not hide the edge of steps on the stairs. Make sure that carpet is firmly attached to the stairs. Fix any carpet that is loose or worn. What can I do on the outside of my home? Use bright outdoor lighting. Repair the edges of walkways and driveways and fix any cracks. Clear paths of anything that can make you trip, such as tools or rocks. Add color or contrast paint or tape to clearly mark and help you see high doorway thresholds. Trim any bushes or trees on the main path into your home. Check that handrails are securely fastened and in good repair.  Both sides of all steps should have handrails. Install guardrails along the edges of any raised decks or porches. Have leaves, snow, and ice cleared regularly. Use sand, salt, or ice melt on walkways during winter months if you live where there is ice and snow. In the garage, clean up any spills right away, including grease or oil spills. What other actions can I take? Review your medicines with your health care provider. Some medicines can make you confused or feel dizzy. This can increase your chance of falling. Wear closed-toe shoes that fit well and support your feet. Wear shoes  that have rubber soles and low heels. Use a cane, walker, scooter, or crutches that help you move around if needed. Talk with your provider about other ways that you can decrease your risk of falls. This may include seeing a physical therapist to learn to do exercises to improve movement and strength. Where to find more information Centers for Disease Control and Prevention, [employer]: TonerPromos.no [employer] on Aging: BaseRingTones.pl National Institute on Aging: BaseRingTones.pl Contact a health care provider if: You are afraid of falling at home. You feel weak, drowsy, or dizzy at home. You fall at home. Get help right away if you: Lose consciousness or have trouble moving after a fall. Have a fall that causes a head injury. These symptoms may be an emergency. Get help right away. Call 911. Do not wait to see if the symptoms will go away. Do not drive yourself to the hospital. This information is not intended to replace advice given to you by your health care provider. Make sure you discuss any questions you have with your health care provider. Document Revised: 08/15/2022 Document Reviewed: 08/15/2022 Elsevier Patient Education  2024 [employer].

## 2024-05-22 NOTE — Progress Notes (Signed)
 Subjective:   Shelly Fischer is a 73 y.o. who presents for a Medicare Wellness preventive visit.  As a reminder, Annual Wellness Visits don't include a physical exam, and some assessments may be limited, especially if this visit is performed virtually. We may recommend an in-person follow-up visit with your provider if needed.  Visit Complete: Virtual I connected with  Shelly Fischer on 05/22/24 by a audio enabled telemedicine application and verified that I am speaking with the correct person using two identifiers.  Patient Location: Home  Provider Location: Home Office  I discussed the limitations of evaluation and management by telemedicine. The patient expressed understanding and agreed to proceed.  Vital Signs: Because this visit was a virtual/telehealth visit, some criteria may be missing or patient reported. Any vitals not documented were not able to be obtained and vitals that have been documented are patient reported.  VideoDeclined- This patient declined Librarian, academic. Therefore the visit was completed with audio only.  Persons Participating in Visit: Patient.  AWV Questionnaire: No: Patient Medicare AWV questionnaire was not completed prior to this visit.  Cardiac Risk Factors include: advanced age (>66men, >17 women);dyslipidemia;hypertension     Objective:     Today's Vitals   05/22/24 0955  Weight: 143 lb (64.9 kg)  Height: 5\' 3"  (1.6 m)   Body mass index is 25.33 kg/m.     05/22/2024   10:08 AM 05/11/2023   11:33 AM 05/02/2022    2:12 PM 04/28/2021    1:40 PM 04/27/2020    2:08 PM 06/12/2017   11:18 AM 04/06/2017    9:36 AM  Advanced Directives  Does Patient Have a Medical Advance Directive? Yes No Yes No No No No;Yes  Type of Estate agent of [place];Living will  Healthcare Power of [place];Living will    Living will  Does patient want to make changes to medical advance directive? No - Patient declined         Copy of Healthcare Power of Attorney in Chart? No - copy requested  No - copy requested      Would patient like information on creating a medical advance directive?  No - Patient declined  Yes (MAU/Ambulatory/Procedural Areas - Information given) Yes (MAU/Ambulatory/Procedural Areas - Information given) No - Patient declined     Current Medications (verified) Outpatient Encounter Medications as of 05/22/2024  Medication Sig   chlorpheniramine (CHLOR-TRIMETON) 4 MG tablet Take 4 mg by mouth daily as needed for allergies.   cholecalciferol (VITAMIN D3) 25 MCG (1000 UNIT) tablet Take 1,000 Units by mouth daily.   cyanocobalamin  (VITAMIN B12) 1000 MCG/ML injection INJECT 1 ML (1,000 MCG) INTRAMUSCULARLY EVERY 30 DAYS   ketoconazole  (NIZORAL ) 2 % shampoo APPLY TOPICALLY 2 TIMES A WEEK   losartan  (COZAAR ) 100 MG tablet TAKE 1 TABLET BY MOUTH EVERY DAY   No facility-administered encounter medications on file as of 05/22/2024.    Allergies (verified) Calcium channel blockers and Loratadine   History: Past Medical History:  Diagnosis Date   Humerus fracture 03/18/2016   06/15/2018 - left proximal treated by Ortho with immobilization     Hypertension    Vitamin B12 deficiency    Past Surgical History:  Procedure Laterality Date   ABDOMINAL HYSTERECTOMY  1990   endometriosis   COLONOSCOPY WITH PROPOFOL  N/A 06/12/2017   Procedure: COLONOSCOPY WITH PROPOFOL ;  Surgeon: Marnee Sink, MD;  Location: [facility];  Service: Endoscopy;  Laterality: N/A;   FLEXIBLE SIGMOIDOSCOPY  Family History  Problem Relation Age of Onset   Hypertension Mother    Pancreatic cancer Mother 46   Hyperlipidemia Father    CAD Father    Parkinson's disease Father    Crohn's disease Father    CAD Brother 55   Social History   Socioeconomic History   Marital status: Widowed    Spouse name: Not on file   Number of children: 2   Years of education: Not on file   Highest education level: Not on  file  Occupational History   Occupation: retired  Tobacco Use   Smoking status: Never    Passive exposure: Past   Smokeless tobacco: Never  Vaping Use   Vaping status: Never Used  Substance and Sexual Activity   Alcohol use: No    Alcohol/week: 0.0 standard drinks of alcohol   Drug use: No   Sexual activity: Not on file  Other Topics Concern   Not on file  Social History Narrative   Not on file   Social Drivers of Health   Financial Resource Strain: Low Risk  (05/22/2024)   Overall Financial Resource Strain (CARDIA)    Difficulty of Paying Living Expenses: Not hard at all  Food Insecurity: No Food Insecurity (05/22/2024)   Hunger Vital Sign    Worried About Running Out of Food in the Last Year: Never true    Ran Out of Food in the Last Year: Never true  Transportation Needs: No Transportation Needs (05/22/2024)   PRAPARE - Administrator, Civil Service (Medical): No    Lack of Transportation (Non-Medical): No  Physical Activity: Inactive (05/22/2024)   Exercise Vital Sign    Days of Exercise per Week: 0 days    Minutes of Exercise per Session: 0 min  Stress: No Stress Concern Present (05/22/2024)   [employer] of Occupational Health - Occupational Stress Questionnaire    Feeling of Stress : Only a little  Social Connections: Socially Isolated (05/22/2024)   Social Connection and Isolation Panel [NHANES]    Frequency of Communication with Friends and Family: More than three times a week    Frequency of Social Gatherings with Friends and Family: Once a week    Attends Religious Services: Never    Database administrator or Organizations: No    Attends Banker Meetings: Never    Marital Status: Widowed    Tobacco Counseling Counseling given: No    Clinical Intake:  Pre-visit preparation completed: Yes  Pain : No/denies pain     BMI - recorded: 25.33 Nutritional Status: BMI 25 -29 Overweight Nutritional Risks: None Diabetes: No  No  results found for: "HGBA1C"   How often do you need to have someone help you when you read instructions, pamphlets, or other written materials from your doctor or pharmacy?: 1 - Never  Interpreter Needed?: No  Information entered by :: Jaunita Messier, CMA   Activities of Daily Living     05/22/2024    9:56 AM  In your present state of health, do you have any difficulty performing the following activities:  Hearing? 0  Vision? 0  Difficulty concentrating or making decisions? 1  Comment making decisions due to grief  Walking or climbing stairs? 0  Dressing or bathing? 0  Doing errands, shopping? 0  Preparing Food and eating ? N  Using the Toilet? N  In the past six months, have you accidently leaked urine? N  Do you have problems with loss of  bowel control? N  Managing your Medications? N  Managing your Finances? N  Housekeeping or managing your Housekeeping? N    Patient Care Team: Sheron Dixons, MD as PCP - General (Internal Medicine) Spencer Dy, OD (Optometry)  Indicate any recent Medical Services you may have received from other than [facility] providers in the past year (date may be approximate).     Assessment:    This is a routine wellness examination for [place].  Hearing/Vision screen Hearing Screening - Comments:: Denies hearing loss Vision Screening - Comments:: Gets routine eye exams, Dr. Spencer Dy [facility] [place], [place]   Goals Addressed               This Visit's Progress     Exercise 150 min/wk Moderate Activity (pt-stated)         Depression Screen     05/22/2024   10:05 AM 02/23/2024    3:23 PM 08/09/2023    9:10 AM 05/11/2023   11:30 AM 01/23/2023    1:23 PM 07/21/2022    9:01 AM 05/02/2022    2:11 PM  PHQ 2/9 Scores  PHQ - 2 Score 0 0 1 1 0 0 0  PHQ- 9 Score 1 2 3  1 3      Fall Risk     05/22/2024   10:11 AM 02/23/2024    3:23 PM 08/09/2023    9:10 AM 05/11/2023   11:33 AM 01/23/2023    1:23 PM  Fall Risk   Falls in the past year? 0 0 0 0  0  Number falls in past yr: 0 0 0 0 0  Injury with Fall? 0 0 0 0 0  Risk for fall due to : No Fall Risks No Fall Risks No Fall Risks No Fall Risks No Fall Risks  Follow up Falls evaluation completed Falls evaluation completed Falls evaluation completed Falls prevention discussed;Falls evaluation completed Falls evaluation completed    MEDICARE RISK AT HOME:  Medicare Risk at Home Any stairs in or around the home?: Yes If so, are there any without handrails?: No Home free of loose throw rugs in walkways, pet beds, electrical cords, etc?: Yes Adequate lighting in your home to reduce risk of falls?: Yes Life alert?: No Use of a cane, walker or w/c?: No Grab bars in the bathroom?: No Shower chair or bench in shower?: No Elevated toilet seat or a handicapped toilet?: Yes  TIMED UP AND GO:  Was the test performed?  No  Cognitive Function: 6CIT completed        05/22/2024   10:13 AM 05/11/2023   11:39 AM 04/19/2018    9:25 AM 04/06/2017    9:38 AM  6CIT Screen  What Year? 0 points 0 points 0 points 0 points  What month? 0 points 0 points 0 points 0 points  What time? 0 points 0 points 0 points 0 points  Count back from 20 0 points 0 points 0 points 2 points  Months in reverse 0 points 0 points 0 points 0 points  Repeat phrase 0 points 0 points 2 points 0 points  Total Score 0 points 0 points 2 points 2 points    Immunizations Immunization History  Administered Date(s) Administered   Influenza, High Dose Seasonal PF 10/19/2017   Influenza-Unspecified 10/16/2019   Moderna Sars-Covid-2 Vaccination 02/24/2020, 03/23/2020   PNEUMOCOCCAL CONJUGATE-20 08/09/2023   Pneumococcal Conjugate-13 03/18/2016   Pneumococcal Polysaccharide-23 04/06/2017   Tdap 06/26/2013    Screening Tests Health Maintenance  Topic Date Due   Zoster Vaccines- Shingrix  (1 of 2) Never done   COVID-19 Vaccine (3 - Moderna risk series) 04/20/2020   DTaP/Tdap/Td (2 - Td or Tdap) 06/27/2023   INFLUENZA  VACCINE  07/26/2024   MAMMOGRAM  09/13/2024   Medicare Annual Wellness (AWV)  05/22/2025   Colonoscopy  06/13/2027   DEXA SCAN  09/08/2027   Pneumonia Vaccine 26+ Years old  Completed   Hepatitis C Screening  Completed   HPV VACCINES  Aged Out   Meningococcal B Vaccine  Aged Out    Health Maintenance  Health Maintenance Due  Topic Date Due   Zoster Vaccines- Shingrix  (1 of 2) Never done   COVID-19 Vaccine (3 - Moderna risk series) 04/20/2020   DTaP/Tdap/Td (2 - Td or Tdap) 06/27/2023   Health Maintenance Items Addressed: Mammogram ordered, See Nurse Notes  Additional Screening:  Vision Screening: Recommended annual ophthalmology exams for early detection of glaucoma and other disorders of the eye.  Dental Screening: Recommended annual dental exams for proper oral hygiene  Community Resource Referral / Chronic Care Management: CRR required this visit?  No   CCM required this visit?  No   Plan:    I have personally reviewed and noted the following in the patient's chart:   Medical and social history Use of alcohol, tobacco or illicit drugs  Current medications and supplements including opioid prescriptions. Patient is not currently taking opioid prescriptions. Functional ability and status Nutritional status Physical activity Advanced directives List of other physicians Hospitalizations, surgeries, and ER visits in previous 12 months Vitals Screenings to include cognitive, depression, and falls Referrals and appointments  In addition, I have reviewed and discussed with patient certain preventive protocols, quality metrics, and best practice recommendations. A written personalized care plan for preventive services as well as general preventive health recommendations were provided to patient.   Jaunita Messier, CMA   05/22/2024   After Visit Summary: (MyChart) Due to this being a telephonic visit, the after visit summary with patients personalized plan was offered to  patient via MyChart   Notes: Please refer to Routing Comments.

## 2024-08-14 ENCOUNTER — Ambulatory Visit: Payer: Self-pay | Admitting: Internal Medicine

## 2024-08-14 ENCOUNTER — Encounter: Payer: Self-pay | Admitting: Internal Medicine

## 2024-08-14 VITALS — BP 110/70 | HR 71 | Ht 63.0 in | Wt 142.2 lb

## 2024-08-14 DIAGNOSIS — R7989 Other specified abnormal findings of blood chemistry: Secondary | ICD-10-CM

## 2024-08-14 DIAGNOSIS — Z1231 Encounter for screening mammogram for malignant neoplasm of breast: Secondary | ICD-10-CM | POA: Diagnosis not present

## 2024-08-14 DIAGNOSIS — I10 Essential (primary) hypertension: Secondary | ICD-10-CM | POA: Diagnosis not present

## 2024-08-14 DIAGNOSIS — E785 Hyperlipidemia, unspecified: Secondary | ICD-10-CM

## 2024-08-14 DIAGNOSIS — Z0000 Encounter for general adult medical examination without abnormal findings: Secondary | ICD-10-CM

## 2024-08-14 DIAGNOSIS — E559 Vitamin D deficiency, unspecified: Secondary | ICD-10-CM | POA: Diagnosis not present

## 2024-08-14 DIAGNOSIS — M8580 Other specified disorders of bone density and structure, unspecified site: Secondary | ICD-10-CM

## 2024-08-14 NOTE — Assessment & Plan Note (Signed)
 Now back on daily supplements Will check levels

## 2024-08-14 NOTE — Progress Notes (Signed)
 Date:  08/14/2024   Name:  Shelly Fischer   DOB:  [birth year]   MRN:  [id number]   Chief Complaint: Annual Exam and Insomnia Shelly Fischer is a 73 y.o. female who presents today for her Complete Annual Exam. She feels well. She reports exercising - walking. She reports she is sleeping poorly. Breast complaints - none.  Health Maintenance  Topic Date Due   COVID-19 Vaccine (3 - Moderna risk series) 04/20/2020   DTaP/Tdap/Td vaccine (2 - Td or Tdap) 06/27/2023   Zoster (Shingles) Vaccine (1 of 2) 11/14/2024*   Flu Shot  03/25/2025*   Mammogram  09/13/2024   Medicare Annual Wellness Visit  05/22/2025   Colon Cancer Screening  06/13/2027   DEXA scan (bone density measurement)  09/08/2027   Pneumococcal Vaccine for age over 85  Completed   Hepatitis C Screening  Completed   HPV Vaccine  Aged Out   Meningitis B Vaccine  Aged Out  *Topic was postponed. The date shown is not the original due date.    Hypertension This is a chronic problem. The problem is controlled. Pertinent negatives include no chest pain, headaches, palpitations or shortness of breath. Past treatments include angiotensin blockers.  Vitamin D  def - she forgot to take it for a while and noticed more fatigue.  She has since resumed it daily.   OP - noted on last DEXA.  She has no hx of falls or fractures.  She continues on calcium and vitamin D .  Defers repeat DEXA to next year.  Review of Systems  Constitutional:  Negative for fatigue and unexpected weight change.  HENT:  Negative for trouble swallowing.   Eyes:  Negative for visual disturbance.  Respiratory:  Negative for cough, chest tightness, shortness of breath and wheezing.   Cardiovascular:  Negative for chest pain, palpitations and leg swelling.  Gastrointestinal:  Negative for abdominal pain, constipation and diarrhea.  Musculoskeletal:  Negative for arthralgias and myalgias.  Neurological:  Negative for dizziness, weakness, light-headedness and  headaches.     Lab Results  Component Value Date   NA 139 08/09/2023   K 3.8 08/09/2023   CO2 27 08/09/2023   GLUCOSE 100 (H) 08/09/2023   BUN 12 08/09/2023   CREATININE 0.69 08/09/2023   CALCIUM 9.5 08/09/2023   EGFR 91 08/09/2023   GFRNONAA 79 07/10/2020   Lab Results  Component Value Date   CHOL 244 (H) 08/09/2023   HDL 69 08/09/2023   LDLCALC 147 (H) 08/09/2023   TRIG 156 (H) 08/09/2023   CHOLHDL 3.5 08/09/2023   Lab Results  Component Value Date   TSH 5.780 (H) 08/09/2023   No results found for: HGBA1C Lab Results  Component Value Date   WBC 4.6 08/09/2023   HGB 13.9 08/09/2023   HCT 42.2 08/09/2023   MCV 88 08/09/2023   PLT 240 08/09/2023   Lab Results  Component Value Date   ALT 19 08/09/2023   AST 16 08/09/2023   ALKPHOS 185 (H) 08/09/2023   BILITOT 0.6 08/09/2023   Lab Results  Component Value Date   VD25OH 12.6 (L) 08/09/2023     Patient Active Problem List   Diagnosis Date Noted   Vitamin D  deficiency 08/14/2024   Osteopenia determined by x-ray 08/09/2023   Elevated alkaline phosphatase level 01/23/2023   Carpal tunnel syndrome 11/21/2015   Dyslipidemia 11/21/2015   Essential (primary) hypertension 11/21/2015   Hot flash, menopausal 11/21/2015   B12 nutritional deficiency 11/21/2015   Allergic  rhinitis, seasonal 11/21/2015    Allergies  Allergen Reactions   Calcium Channel Blockers Palpitations   Loratadine Palpitations    Past Surgical History:  Procedure Laterality Date   ABDOMINAL HYSTERECTOMY  1990   endometriosis   COLONOSCOPY WITH PROPOFOL  N/A 06/12/2017   Procedure: COLONOSCOPY WITH PROPOFOL ;  Surgeon: Jinny Carmine, MD;  Location: [facility];  Service: Endoscopy;  Laterality: N/A;   FLEXIBLE SIGMOIDOSCOPY      Social History   Tobacco Use   Smoking status: Never    Passive exposure: Past   Smokeless tobacco: Never  Vaping Use   Vaping status: Never Used  Substance Use Topics   Alcohol use: No     Alcohol/week: 0.0 standard drinks of alcohol   Drug use: No     Medication list has been reviewed and updated.  Current Meds  Medication Sig   chlorpheniramine (CHLOR-TRIMETON) 4 MG tablet Take 4 mg by mouth daily as needed for allergies.   cholecalciferol (VITAMIN D3) 25 MCG (1000 UNIT) tablet Take 1,000 Units by mouth daily.   cyanocobalamin  (VITAMIN B12) 1000 MCG/ML injection INJECT 1 ML (1,000 MCG) INTRAMUSCULARLY EVERY 30 DAYS   ketoconazole  (NIZORAL ) 2 % shampoo APPLY TOPICALLY 2 TIMES A WEEK   losartan  (COZAAR ) 100 MG tablet TAKE 1 TABLET BY MOUTH EVERY DAY       08/14/2024    9:11 AM 02/23/2024    3:23 PM 08/09/2023    9:10 AM 01/23/2023    1:23 PM  GAD 7 : Generalized Anxiety Score  Nervous, Anxious, on Edge 0 0 0 0  Control/stop worrying 0 0 1 0  Worry too much - different things 0 0 0 0  Trouble relaxing 0 0 0 0  Restless 0 0 0 0  Easily annoyed or irritable 0 0 0 0  Afraid - awful might happen 0 0 0 0  Total GAD 7 Score 0 0 1 0  Anxiety Difficulty Not difficult at all Not difficult at all Not difficult at all Not difficult at all       08/14/2024    9:11 AM 05/22/2024   10:05 AM 02/23/2024    3:23 PM  Depression screen PHQ 2/9  Decreased Interest 0 0 0  Down, Depressed, Hopeless 0 0 0  PHQ - 2 Score 0 0 0  Altered sleeping 0 0 1  Tired, decreased energy 0 1 1  Change in appetite 0 0 0  Feeling bad or failure about yourself  0 0 0  Trouble concentrating 0 0 0  Moving slowly or fidgety/restless 0 0 0  Suicidal thoughts 0 0 0  PHQ-9 Score 0 1 2  Difficult doing work/chores Not difficult at all Not difficult at all Not difficult at all    BP Readings from Last 3 Encounters:  08/14/24 110/70  02/23/24 132/70  08/11/23 138/78    Physical Exam Vitals and nursing note reviewed.  Constitutional:      General: She is not in acute distress.    Appearance: She is well-developed.  HENT:     Head: Normocephalic and atraumatic.     Right Ear: Tympanic membrane  and ear canal normal.     Left Ear: Tympanic membrane and ear canal normal.     Nose:     Right Sinus: No maxillary sinus tenderness.     Left Sinus: No maxillary sinus tenderness.  Eyes:     General: No scleral icterus.       Right eye: No  discharge.        Left eye: No discharge.     Conjunctiva/sclera: Conjunctivae normal.  Neck:     Thyroid: No thyromegaly.     Vascular: No carotid bruit.  Cardiovascular:     Rate and Rhythm: Normal rate and regular rhythm.     Pulses: Normal pulses.     Heart sounds: Normal heart sounds.  Pulmonary:     Effort: Pulmonary effort is normal. No respiratory distress.     Breath sounds: No wheezing.  Abdominal:     General: Bowel sounds are normal.     Palpations: Abdomen is soft.     Tenderness: There is no abdominal tenderness.  Musculoskeletal:     Cervical back: Normal range of motion. No erythema.     Right lower leg: No edema.     Left lower leg: No edema.  Lymphadenopathy:     Cervical: No cervical adenopathy.  Skin:    General: Skin is warm and dry.     Findings: No rash.  Neurological:     Mental Status: She is alert and oriented to person, place, and time.     Cranial Nerves: No cranial nerve deficit.     Sensory: No sensory deficit.     Deep Tendon Reflexes: Reflexes are normal and symmetric.  Psychiatric:        Attention and Perception: Attention normal.        Mood and Affect: Mood normal.     Wt Readings from Last 3 Encounters:  08/14/24 142 lb 3.2 oz (64.5 kg)  05/22/24 143 lb (64.9 kg)  02/23/24 148 lb 3.2 oz (67.2 kg)    BP 110/70   Pulse 71   Ht 5' 3 (1.6 m)   Wt 142 lb 3.2 oz (64.5 kg)   SpO2 97%   BMI 25.19 kg/m   Assessment and Plan:  Problem List Items Addressed This Visit       Unprioritized   Dyslipidemia (Chronic)   Managed with diet and exercise. Lab Results  Component Value Date   LDLCALC 147 (H) 08/09/2023         Relevant Orders   Lipid panel   Essential (primary) hypertension  (Chronic)   Blood pressure is well controlled on Losartan . No medication side effects noted. Plan to continue current medications.       Relevant Orders   CBC with Differential/Platelet   Comprehensive metabolic panel with GFR   Urinalysis, Routine w reflex microscopic   Osteopenia determined by x-ray   Does not want to repeat DEXA at this time Will continue calcium and vitamin D       Vitamin D  deficiency   Now back on daily supplements Will check levels      Relevant Orders   VITAMIN D  25 Hydroxy (Vit-D Deficiency, Fractures)   Other Visit Diagnoses       Annual physical exam    -  Primary   continue healthy diet and exercise up to date on screening and immunization     Encounter for screening mammogram for breast cancer       ordered - pt reminded to schedule.     Elevated TSH       Relevant Orders   TSH + free T4       Return in about 6 months (around 02/14/2025) for HTN - Dr. Lemon.    Leita HILARIO Adie, MD [facility] [place]

## 2024-08-14 NOTE — Assessment & Plan Note (Signed)
 Managed with diet and exercise. Lab Results  Component Value Date   LDLCALC 147 (H) 08/09/2023

## 2024-08-14 NOTE — Assessment & Plan Note (Signed)
 Blood pressure is well controlled on Losartan . No medication side effects noted. Plan to continue current medications.

## 2024-08-14 NOTE — Patient Instructions (Addendum)
 Call [facility] to schedule your mammogram at [phone].  Below you will find the link to the [facility] community research project I mentioned through [employer]. This is a free genetic screening opportunity specifically looking for three conditions: 1. Hereditary breast and ovarian cancer syndrome 2. Lynch syndrome (increased risks for colorectal, endometrial and other cancers) 3. Familial hypercholesterolemia (very high cholesterol) We focus on these three conditions because they can occur in the general population, and if you discover you have one of these conditions, there are specific actions you can take to reduce your risk. If you'd like to learn more, I recommend visiting this link: [email or web address]

## 2024-08-14 NOTE — Assessment & Plan Note (Signed)
 Does not want to repeat DEXA at this time Will continue calcium and vitamin D 

## 2024-08-15 ENCOUNTER — Ambulatory Visit: Payer: Self-pay | Admitting: Internal Medicine

## 2024-08-15 DIAGNOSIS — E785 Hyperlipidemia, unspecified: Secondary | ICD-10-CM

## 2024-08-15 LAB — CBC WITH DIFFERENTIAL/PLATELET
Basophils Absolute: 0.1 x10E3/uL (ref 0.0–0.2)
Basos: 1 %
EOS (ABSOLUTE): 0.3 x10E3/uL (ref 0.0–0.4)
Eos: 5 %
Hematocrit: 45.1 % (ref 34.0–46.6)
Hemoglobin: 14.7 g/dL (ref 11.1–15.9)
Immature Grans (Abs): 0 x10E3/uL (ref 0.0–0.1)
Immature Granulocytes: 0 %
Lymphocytes Absolute: 1.2 x10E3/uL (ref 0.7–3.1)
Lymphs: 25 %
MCH: 28.7 pg (ref 26.6–33.0)
MCHC: 32.6 g/dL (ref 31.5–35.7)
MCV: 88 fL (ref 79–97)
Monocytes Absolute: 0.4 x10E3/uL (ref 0.1–0.9)
Monocytes: 8 %
Neutrophils Absolute: 3 x10E3/uL (ref 1.4–7.0)
Neutrophils: 61 %
Platelets: 285 x10E3/uL (ref 150–450)
RBC: 5.13 x10E6/uL (ref 3.77–5.28)
RDW: 13.1 % (ref 11.7–15.4)
WBC: 5 x10E3/uL (ref 3.4–10.8)

## 2024-08-15 LAB — LIPID PANEL
Chol/HDL Ratio: 3.3 ratio (ref 0.0–4.4)
Cholesterol, Total: 270 mg/dL — ABNORMAL HIGH (ref 100–199)
HDL: 81 mg/dL (ref >39)
LDL Chol Calc (NIH): 169 mg/dL — ABNORMAL HIGH (ref 0–99)
Triglycerides: 118 mg/dL (ref 0–149)
VLDL Cholesterol Cal: 20 mg/dL (ref 5–40)

## 2024-08-15 LAB — COMPREHENSIVE METABOLIC PANEL WITH GFR
ALT: 16 IU/L (ref 0–32)
AST: 20 IU/L (ref 0–40)
Albumin: 4.9 g/dL — ABNORMAL HIGH (ref 3.8–4.8)
Alkaline Phosphatase: 158 IU/L — ABNORMAL HIGH (ref 44–121)
BUN/Creatinine Ratio: 17 (ref 12–28)
BUN: 12 mg/dL (ref 8–27)
Bilirubin Total: 0.7 mg/dL (ref 0.0–1.2)
CO2: 25 mmol/L (ref 20–29)
Calcium: 10.1 mg/dL (ref 8.7–10.3)
Chloride: 100 mmol/L (ref 96–106)
Creatinine, Ser: 0.71 mg/dL (ref 0.57–1.00)
Globulin, Total: 2.5 g/dL (ref 1.5–4.5)
Glucose: 96 mg/dL (ref 70–99)
Potassium: 4.1 mmol/L (ref 3.5–5.2)
Sodium: 141 mmol/L (ref 134–144)
Total Protein: 7.4 g/dL (ref 6.0–8.5)
eGFR: 89 mL/min/1.73 (ref >59)

## 2024-08-15 LAB — URINALYSIS, ROUTINE W REFLEX MICROSCOPIC
Bilirubin, UA: NEGATIVE
Glucose, UA: NEGATIVE
Ketones, UA: NEGATIVE
Leukocytes,UA: NEGATIVE
Nitrite, UA: NEGATIVE
RBC, UA: NEGATIVE
Specific Gravity, UA: 1.023 (ref 1.005–1.030)
Urobilinogen, Ur: 0.2 mg/dL (ref 0.2–1.0)
pH, UA: 5.5 (ref 5.0–7.5)

## 2024-08-15 LAB — TSH+FREE T4
Free T4: 1.09 ng/dL (ref 0.82–1.77)
TSH: 5.79 u[IU]/mL — ABNORMAL HIGH (ref 0.450–4.500)

## 2024-08-15 LAB — VITAMIN D 25 HYDROXY (VIT D DEFICIENCY, FRACTURES): Vit D, 25-Hydroxy: 17.3 ng/mL — ABNORMAL LOW (ref 30.0–100.0)

## 2024-08-15 LAB — MICROSCOPIC EXAMINATION
Bacteria, UA: NONE SEEN
Casts: NONE SEEN /LPF
RBC, Urine: NONE SEEN /HPF (ref 0–2)
WBC, UA: NONE SEEN /HPF (ref 0–5)

## 2024-09-04 ENCOUNTER — Other Ambulatory Visit: Payer: Self-pay | Admitting: Internal Medicine

## 2024-09-04 DIAGNOSIS — I10 Essential (primary) hypertension: Secondary | ICD-10-CM

## 2024-09-05 NOTE — Telephone Encounter (Signed)
 Requested Prescriptions  Pending Prescriptions Disp Refills   losartan  (COZAAR ) 100 MG tablet [Pharmacy Med Name: LOSARTAN  POTASSIUM 100 MG TAB] 90 tablet 1    Sig: TAKE 1 TABLET BY MOUTH EVERY DAY     Cardiovascular:  Angiotensin Receptor Blockers Passed - 09/05/2024 11:07 AM      Passed - Cr in normal range and within 180 days    Creatinine, Ser  Date Value Ref Range Status  08/14/2024 0.71 0.57 - 1.00 mg/dL Final         Passed - K in normal range and within 180 days    Potassium  Date Value Ref Range Status  08/14/2024 4.1 3.5 - 5.2 mmol/L Final         Passed - Patient is not pregnant      Passed - Last BP in normal range    BP Readings from Last 1 Encounters:  08/14/24 110/70         Passed - Valid encounter within last 6 months    Recent Outpatient Visits           3 weeks ago Annual physical exam   [facility] at [facility], Leita DEL, MD   6 months ago Essential (primary) hypertension   [facility] at [facility], Leita DEL, MD

## 2025-02-14 ENCOUNTER — Ambulatory Visit: Admitting: Student

## 2025-05-28 ENCOUNTER — Ambulatory Visit
